# Patient Record
Sex: Female | Born: 1940
Health system: Southern US, Community
[De-identification: ages and names within clinical notes are randomized; demographics above are authoritative.]

## PROBLEM LIST (undated history)

## (undated) DIAGNOSIS — I4891 Unspecified atrial fibrillation: Secondary | ICD-10-CM

## (undated) DIAGNOSIS — H269 Unspecified cataract: Secondary | ICD-10-CM

## (undated) DIAGNOSIS — M8580 Other specified disorders of bone density and structure, unspecified site: Secondary | ICD-10-CM

## (undated) DIAGNOSIS — J449 Chronic obstructive pulmonary disease, unspecified: Secondary | ICD-10-CM

## (undated) DIAGNOSIS — M1990 Unspecified osteoarthritis, unspecified site: Secondary | ICD-10-CM

## (undated) DIAGNOSIS — E039 Hypothyroidism, unspecified: Secondary | ICD-10-CM

## (undated) DIAGNOSIS — T7840XA Allergy, unspecified, initial encounter: Secondary | ICD-10-CM

## (undated) DIAGNOSIS — E785 Hyperlipidemia, unspecified: Secondary | ICD-10-CM

## (undated) HISTORY — DX: Unspecified osteoarthritis, unspecified site: M19.90

## (undated) HISTORY — DX: Other specified disorders of bone density and structure, unspecified site: M85.80

## (undated) HISTORY — PX: VAGINAL HYSTERECTOMY: SUR661

## (undated) HISTORY — DX: Unspecified atrial fibrillation: I48.91

## (undated) HISTORY — DX: Hyperlipidemia, unspecified: E78.5

## (undated) HISTORY — DX: Unspecified cataract: H26.9

## (undated) HISTORY — DX: Allergy, unspecified, initial encounter: T78.40XA

## (undated) HISTORY — DX: Chronic obstructive pulmonary disease, unspecified: J44.9

## (undated) HISTORY — DX: Hypothyroidism, unspecified: E03.9

## (undated) HISTORY — PX: CHOLECYSTECTOMY: SHX55

---

## 2011-11-14 DIAGNOSIS — E559 Vitamin D deficiency, unspecified: Secondary | ICD-10-CM | POA: Insufficient documentation

## 2015-06-12 DIAGNOSIS — E782 Mixed hyperlipidemia: Secondary | ICD-10-CM | POA: Insufficient documentation

## 2015-06-13 DIAGNOSIS — E034 Atrophy of thyroid (acquired): Secondary | ICD-10-CM | POA: Insufficient documentation

## 2015-09-09 DIAGNOSIS — J189 Pneumonia, unspecified organism: Secondary | ICD-10-CM | POA: Insufficient documentation

## 2015-09-09 DIAGNOSIS — I480 Paroxysmal atrial fibrillation: Secondary | ICD-10-CM | POA: Insufficient documentation

## 2017-11-27 DIAGNOSIS — R0609 Other forms of dyspnea: Secondary | ICD-10-CM | POA: Insufficient documentation

## 2017-11-27 DIAGNOSIS — R9431 Abnormal electrocardiogram [ECG] [EKG]: Secondary | ICD-10-CM | POA: Insufficient documentation

## 2018-10-21 DIAGNOSIS — E782 Mixed hyperlipidemia: Secondary | ICD-10-CM | POA: Diagnosis not present

## 2018-10-21 DIAGNOSIS — Z0000 Encounter for general adult medical examination without abnormal findings: Secondary | ICD-10-CM | POA: Diagnosis not present

## 2018-10-21 DIAGNOSIS — E559 Vitamin D deficiency, unspecified: Secondary | ICD-10-CM | POA: Diagnosis not present

## 2018-10-21 DIAGNOSIS — Z1239 Encounter for other screening for malignant neoplasm of breast: Secondary | ICD-10-CM | POA: Diagnosis not present

## 2018-10-21 DIAGNOSIS — Z23 Encounter for immunization: Secondary | ICD-10-CM | POA: Diagnosis not present

## 2018-10-21 DIAGNOSIS — I480 Paroxysmal atrial fibrillation: Secondary | ICD-10-CM | POA: Diagnosis not present

## 2019-01-14 DIAGNOSIS — I480 Paroxysmal atrial fibrillation: Secondary | ICD-10-CM | POA: Diagnosis not present

## 2019-01-21 DIAGNOSIS — M8580 Other specified disorders of bone density and structure, unspecified site: Secondary | ICD-10-CM | POA: Diagnosis not present

## 2019-01-21 DIAGNOSIS — E782 Mixed hyperlipidemia: Secondary | ICD-10-CM | POA: Diagnosis not present

## 2019-01-21 DIAGNOSIS — Z23 Encounter for immunization: Secondary | ICD-10-CM | POA: Diagnosis not present

## 2019-01-21 DIAGNOSIS — I4891 Unspecified atrial fibrillation: Secondary | ICD-10-CM | POA: Diagnosis not present

## 2019-01-21 DIAGNOSIS — E034 Atrophy of thyroid (acquired): Secondary | ICD-10-CM | POA: Diagnosis not present

## 2019-01-21 DIAGNOSIS — I480 Paroxysmal atrial fibrillation: Secondary | ICD-10-CM | POA: Diagnosis not present

## 2019-01-21 DIAGNOSIS — R9431 Abnormal electrocardiogram [ECG] [EKG]: Secondary | ICD-10-CM | POA: Diagnosis not present

## 2019-01-21 DIAGNOSIS — Z780 Asymptomatic menopausal state: Secondary | ICD-10-CM | POA: Diagnosis not present

## 2019-01-21 DIAGNOSIS — E559 Vitamin D deficiency, unspecified: Secondary | ICD-10-CM | POA: Diagnosis not present

## 2019-05-06 DIAGNOSIS — E034 Atrophy of thyroid (acquired): Secondary | ICD-10-CM | POA: Diagnosis not present

## 2019-05-06 DIAGNOSIS — Z23 Encounter for immunization: Secondary | ICD-10-CM | POA: Diagnosis not present

## 2019-05-06 DIAGNOSIS — E559 Vitamin D deficiency, unspecified: Secondary | ICD-10-CM | POA: Diagnosis not present

## 2019-05-06 DIAGNOSIS — I480 Paroxysmal atrial fibrillation: Secondary | ICD-10-CM | POA: Diagnosis not present

## 2019-05-06 DIAGNOSIS — Z780 Asymptomatic menopausal state: Secondary | ICD-10-CM | POA: Diagnosis not present

## 2019-05-06 DIAGNOSIS — E782 Mixed hyperlipidemia: Secondary | ICD-10-CM | POA: Diagnosis not present

## 2019-07-28 DIAGNOSIS — H2513 Age-related nuclear cataract, bilateral: Secondary | ICD-10-CM | POA: Diagnosis not present

## 2019-07-28 DIAGNOSIS — H524 Presbyopia: Secondary | ICD-10-CM | POA: Diagnosis not present

## 2019-09-14 DIAGNOSIS — I480 Paroxysmal atrial fibrillation: Secondary | ICD-10-CM | POA: Diagnosis not present

## 2019-09-14 DIAGNOSIS — Z23 Encounter for immunization: Secondary | ICD-10-CM | POA: Diagnosis not present

## 2019-09-14 DIAGNOSIS — E034 Atrophy of thyroid (acquired): Secondary | ICD-10-CM | POA: Diagnosis not present

## 2019-09-14 DIAGNOSIS — E559 Vitamin D deficiency, unspecified: Secondary | ICD-10-CM | POA: Diagnosis not present

## 2019-09-14 DIAGNOSIS — E782 Mixed hyperlipidemia: Secondary | ICD-10-CM | POA: Diagnosis not present

## 2019-12-20 DIAGNOSIS — Z0000 Encounter for general adult medical examination without abnormal findings: Secondary | ICD-10-CM | POA: Diagnosis not present

## 2019-12-20 DIAGNOSIS — E782 Mixed hyperlipidemia: Secondary | ICD-10-CM | POA: Diagnosis not present

## 2019-12-20 DIAGNOSIS — M8580 Other specified disorders of bone density and structure, unspecified site: Secondary | ICD-10-CM | POA: Diagnosis not present

## 2019-12-20 DIAGNOSIS — R9431 Abnormal electrocardiogram [ECG] [EKG]: Secondary | ICD-10-CM | POA: Diagnosis not present

## 2019-12-20 DIAGNOSIS — I4891 Unspecified atrial fibrillation: Secondary | ICD-10-CM | POA: Diagnosis not present

## 2019-12-20 DIAGNOSIS — Z1239 Encounter for other screening for malignant neoplasm of breast: Secondary | ICD-10-CM | POA: Diagnosis not present

## 2019-12-20 DIAGNOSIS — I480 Paroxysmal atrial fibrillation: Secondary | ICD-10-CM | POA: Diagnosis not present

## 2019-12-20 DIAGNOSIS — E559 Vitamin D deficiency, unspecified: Secondary | ICD-10-CM | POA: Diagnosis not present

## 2019-12-20 DIAGNOSIS — E034 Atrophy of thyroid (acquired): Secondary | ICD-10-CM | POA: Diagnosis not present

## 2020-05-02 ENCOUNTER — Other Ambulatory Visit: Payer: Self-pay

## 2020-05-02 ENCOUNTER — Ambulatory Visit (INDEPENDENT_AMBULATORY_CARE_PROVIDER_SITE_OTHER): Payer: Medicare Other | Admitting: Family Medicine

## 2020-05-02 ENCOUNTER — Encounter: Payer: Self-pay | Admitting: Family Medicine

## 2020-05-02 VITALS — BP 123/72 | HR 61 | Temp 98.2°F | Ht 62.0 in | Wt 132.1 lb

## 2020-05-02 DIAGNOSIS — Z23 Encounter for immunization: Secondary | ICD-10-CM | POA: Diagnosis not present

## 2020-05-02 DIAGNOSIS — E039 Hypothyroidism, unspecified: Secondary | ICD-10-CM | POA: Diagnosis not present

## 2020-05-02 DIAGNOSIS — I4891 Unspecified atrial fibrillation: Secondary | ICD-10-CM | POA: Insufficient documentation

## 2020-05-02 DIAGNOSIS — E782 Mixed hyperlipidemia: Secondary | ICD-10-CM | POA: Diagnosis not present

## 2020-05-02 DIAGNOSIS — Z7901 Long term (current) use of anticoagulants: Secondary | ICD-10-CM | POA: Insufficient documentation

## 2020-05-02 DIAGNOSIS — G4700 Insomnia, unspecified: Secondary | ICD-10-CM | POA: Insufficient documentation

## 2020-05-02 DIAGNOSIS — M8580 Other specified disorders of bone density and structure, unspecified site: Secondary | ICD-10-CM

## 2020-05-02 DIAGNOSIS — I480 Paroxysmal atrial fibrillation: Secondary | ICD-10-CM

## 2020-05-02 DIAGNOSIS — E785 Hyperlipidemia, unspecified: Secondary | ICD-10-CM | POA: Insufficient documentation

## 2020-05-02 NOTE — Assessment & Plan Note (Signed)
New problem Trying melatonin at bedtime to see if this is helpful Will reassess at next visit

## 2020-05-02 NOTE — Assessment & Plan Note (Signed)
Chronic, paroxysmal Currently in normal sinus rhythm Continue Eliquis for chronic anticoagulation and metoprolol for rate control Reports she is status post cardioversion, but will review records when they are available Would like to continue seeing cardiology, so referral placed today to local cardiologist She does report a mild, leaky valve, so we will review echo results when those are available as well

## 2020-05-02 NOTE — Assessment & Plan Note (Signed)
On Eliquis for A. fib as above Check CBC

## 2020-05-02 NOTE — Assessment & Plan Note (Signed)
Unclear when her last DEXA scan was, so we will request records Continue vitamin D supplementation Discussed diet rich in calcium Discussed importance of weightbearing exercise

## 2020-05-02 NOTE — Assessment & Plan Note (Signed)
Patient reports dose of Synthroid was increased in 11/2019 as her TSH was elevated We will request records of previous TSH Continue current dose of Synthroid at this time as she is asymptomatic Recheck TSH and adjust Synthroid dose as needed

## 2020-05-02 NOTE — Progress Notes (Signed)
New Patient Office Visit  Subjective:  Patient ID: Emma Villarreal, female    DOB: Apr 21, 1941  Age: 80 y.o. MRN: [id number]  CC:  Chief Complaint  Patient presents with  . Establish Care    HPI Emma Villarreal presents for establishing care.    She was diagnosed with a fib in the hospital in the past and is s/p cardioversion.  She is taking eliquis and metoprolol for anticoag and rate control.  Seeing Dr Tiburcio Pea in [facility] for cardiology.  Was told that she had a leaky heart valve on last Echo - will send ROI for records.  Osteopenia: Taking Vit D daily.  HLD: Taking Atorvastatin 10 mg daily with good compliance. Unknown previous control.  Hypothyroidism: Taking Synthroid 50mg  daily with good compliance. Asymptomatic  Insomnia: Taking melatonin for the last few nights.  Having trouble falling asleep, not staying asleep    Past Medical History:  Diagnosis Date  . A-fib (HCC)   . Allergy   . Arthritis   . Cataract   . COPD (chronic obstructive pulmonary disease) (HCC)    patient reported not validated  . Hyperlipidemia   . Hypothyroidism   . Osteopenia     Past Surgical History:  Procedure Laterality Date  . CHOLECYSTECTOMY    . VAGINAL HYSTERECTOMY      Family History  Problem Relation Age of Onset  . Diabetes Daughter   . Stroke Maternal Grandmother   . Stroke Mother   . Dementia Mother   . Diabetes Daughter   . Colon cancer Maternal Aunt   . Breast cancer Neg Hx     Social History   Socioeconomic History  . Marital status: Widowed    Spouse name: Not on file  . Number of children: 5  . Years of education: Not on file  . Highest education level: Not on file  Occupational History  . Occupation: retired    Comment:   Tobacco Use  . Smoking status: Current Every Day Smoker    Packs/day: 0.50    Years: 60.00    Pack years: 30.00    Types: Cigarettes  . Smokeless tobacco: Never Used  Vaping Use  . Vaping Use: Never used  Substance and  Sexual Activity  . Alcohol use: Not Currently  . Drug use: Never  . Sexual activity: Not Currently  Other Topics Concern  . Not on file  Social History Narrative  . Not on file   Social Determinants of Health   Financial Resource Strain: Not on file  Food Insecurity: Not on file  Transportation Needs: Not on file  Physical Activity: Not on file  Stress: Not on file  Social Connections: Not on file  Intimate Partner Violence: Not on file    ROS Review of Systems  Constitutional: Positive for activity change.  HENT: Positive for congestion, dental problem, hearing loss, rhinorrhea, sinus pressure and sneezing.   Eyes: Positive for visual disturbance.  Respiratory: Positive for cough and shortness of breath.   Endocrine: Positive for cold intolerance and polyuria.  Musculoskeletal: Positive for arthralgias, back pain and neck stiffness.  Allergic/Immunologic: Positive for environmental allergies.  Hematological: Bruises/bleeds easily.    Objective:   Today's Vitals: BP 123/72 (BP Location: Left Arm, Patient Position: Sitting, Cuff Size: Large)   Pulse 61   Temp 98.2 F (36.8 C) (Oral)   Ht 5\' 2"  (1.575 m)   Wt 132 lb 1.6 oz (59.9 kg)   BMI 24.16 kg/m   Physical  Exam Vitals reviewed.  Constitutional:      General: She is not in acute distress.    Appearance: Normal appearance. She is well-developed. She is not diaphoretic.  HENT:     Head: Normocephalic and atraumatic.     Right Ear: Tympanic membrane, ear canal and external ear normal.     Left Ear: Tympanic membrane, ear canal and external ear normal.  Eyes:     General: No scleral icterus.    Conjunctiva/sclera: Conjunctivae normal.     Pupils: Pupils are equal, round, and reactive to light.  Neck:     Thyroid: No thyromegaly.  Cardiovascular:     Rate and Rhythm: Normal rate and regular rhythm.     Pulses: Normal pulses.     Heart sounds: Normal heart sounds. No murmur heard.   Pulmonary:     Effort:  Pulmonary effort is normal. No respiratory distress.     Breath sounds: Normal breath sounds. No wheezing or rales.  Abdominal:     General: There is no distension.     Palpations: Abdomen is soft.     Tenderness: There is no abdominal tenderness.  Musculoskeletal:        General: No deformity.     Cervical back: Neck supple.     Right lower leg: No edema.     Left lower leg: No edema.  Lymphadenopathy:     Cervical: No cervical adenopathy.  Skin:    General: Skin is warm and dry.     Findings: No rash.  Neurological:     Mental Status: She is alert and oriented to person, place, and time. Mental status is at baseline.     Sensory: No sensory deficit.     Motor: No weakness.     Gait: Gait normal.  Psychiatric:        Mood and Affect: Mood normal.        Behavior: Behavior normal.        Thought Content: Thought content normal.     Assessment & Plan:   Problem List Items Addressed This Visit      Cardiovascular and Mediastinum   A-fib (HCC) - Primary    Chronic, paroxysmal Currently in normal sinus rhythm Continue Eliquis for chronic anticoagulation and metoprolol for rate control Reports she is status post cardioversion, but will review records when they are available Would like to continue seeing cardiology, so referral placed today to local cardiologist She does report a mild, leaky valve, so we will review echo results when those are available as well      Relevant Medications   metoprolol tartrate (LOPRESSOR) 25 MG tablet   atorvastatin (LIPITOR) 10 MG tablet   apixaban (ELIQUIS) 5 MG TABS tablet   Other Relevant Orders   Ambulatory referral to Cardiology   CBC     Endocrine   Hypothyroidism    Patient reports dose of Synthroid was increased in 11/2019 as her TSH was elevated We will request records of previous TSH Continue current dose of Synthroid at this time as she is asymptomatic Recheck TSH and adjust Synthroid dose as needed      Relevant  Medications   metoprolol tartrate (LOPRESSOR) 25 MG tablet   levothyroxine (SYNTHROID) 50 MCG tablet   Other Relevant Orders   TSH     Musculoskeletal and Integument   Osteopenia    Unclear when her last DEXA scan was, so we will request records Continue vitamin D supplementation Discussed diet rich in calcium  Discussed importance of weightbearing exercise        Other   Hyperlipidemia    Previous control unknown ROI sent to previous PCP for last labs Continue atorvastatin 10 mg daily She is having no side effects Recheck FLP and CMP      Relevant Medications   metoprolol tartrate (LOPRESSOR) 25 MG tablet   atorvastatin (LIPITOR) 10 MG tablet   apixaban (ELIQUIS) 5 MG TABS tablet   Other Relevant Orders   Lipid panel   Comprehensive metabolic panel   Insomnia    New problem Trying melatonin at bedtime to see if this is helpful Will reassess at next visit      Chronic anticoagulation    On Eliquis for A. fib as above Check CBC      Relevant Orders   CBC    Other Visit Diagnoses    Influenza vaccine needed       Relevant Orders   Flu vaccine HIGH DOSE PF (Completed)      ROI sent to former PCP and Cardiologist COVID vaccines abstracted into chart   Follow-up: Return in about 6 months (around 10/30/2020) for CPE, AWV.  Total time spent on today's visit was greater than 45 minutes, including both face-to-face time and nonface-to-face time personally spent on review of chart (labs and imaging), discussing labs and goals, discussing further work-up, treatment options, referrals to specialist if needed, reviewing outside records of pertinent, answering patient's questions, and coordinating care.    I, Shirlee Latch, MD, have reviewed all documentation for this visit. The documentation on 05/02/20 for the exam, diagnosis, procedures, and orders are all accurate and complete.   Darlene Brozowski, Marzella Schlein, MD, MPH [facility]

## 2020-05-02 NOTE — Assessment & Plan Note (Signed)
Previous control unknown ROI sent to previous PCP for last labs Continue atorvastatin 10 mg daily She is having no side effects Recheck FLP and CMP

## 2020-05-03 ENCOUNTER — Telehealth: Payer: Self-pay

## 2020-05-03 NOTE — Telephone Encounter (Signed)
Did not see any documentation about a call.    Copied from CRM [id number]. Topic: General - Other >> May 03, 2020 11:16 AM Elliot Gault wrote: Reason for CRM: patient returning "Emma Villarreal" call, please advise

## 2020-05-19 DIAGNOSIS — I4891 Unspecified atrial fibrillation: Secondary | ICD-10-CM | POA: Diagnosis not present

## 2020-05-19 DIAGNOSIS — E039 Hypothyroidism, unspecified: Secondary | ICD-10-CM | POA: Diagnosis not present

## 2020-05-19 DIAGNOSIS — E782 Mixed hyperlipidemia: Secondary | ICD-10-CM | POA: Diagnosis not present

## 2020-05-19 DIAGNOSIS — Z7901 Long term (current) use of anticoagulants: Secondary | ICD-10-CM | POA: Diagnosis not present

## 2020-05-20 LAB — LIPID PANEL
Chol/HDL Ratio: 2.3 ratio (ref 0.0–4.4)
Cholesterol, Total: 147 mg/dL (ref 100–199)
HDL: 63 mg/dL (ref >39)
LDL Chol Calc (NIH): 59 mg/dL (ref 0–99)
Triglycerides: 149 mg/dL (ref 0–149)
VLDL Cholesterol Cal: 25 mg/dL (ref 5–40)

## 2020-05-20 LAB — COMPREHENSIVE METABOLIC PANEL
ALT: 29 IU/L (ref 0–32)
AST: 27 IU/L (ref 0–40)
Albumin/Globulin Ratio: 1.6 (ref 1.2–2.2)
Albumin: 4.7 g/dL (ref 3.7–4.7)
Alkaline Phosphatase: 69 IU/L (ref 44–121)
BUN/Creatinine Ratio: 13 (ref 12–28)
BUN: 11 mg/dL (ref 8–27)
Bilirubin Total: 0.5 mg/dL (ref 0.0–1.2)
CO2: 27 mmol/L (ref 20–29)
Calcium: 9.9 mg/dL (ref 8.7–10.3)
Chloride: 99 mmol/L (ref 96–106)
Creatinine, Ser: 0.86 mg/dL (ref 0.57–1.00)
GFR calc Af Amer: 74 mL/min/{1.73_m2} (ref >59)
GFR calc non Af Amer: 64 mL/min/{1.73_m2} (ref >59)
Globulin, Total: 2.9 g/dL (ref 1.5–4.5)
Glucose: 112 mg/dL — ABNORMAL HIGH (ref 65–99)
Potassium: 5 mmol/L (ref 3.5–5.2)
Sodium: 140 mmol/L (ref 134–144)
Total Protein: 7.6 g/dL (ref 6.0–8.5)

## 2020-05-20 LAB — CBC
Hematocrit: 46.9 % — ABNORMAL HIGH (ref 34.0–46.6)
Hemoglobin: 15.1 g/dL (ref 11.1–15.9)
MCH: 31.5 pg (ref 26.6–33.0)
MCHC: 32.2 g/dL (ref 31.5–35.7)
MCV: 98 fL — ABNORMAL HIGH (ref 79–97)
Platelets: 228 10*3/uL (ref 150–450)
RBC: 4.8 x10E6/uL (ref 3.77–5.28)
RDW: 12.6 % (ref 11.7–15.4)
WBC: 8.2 10*3/uL (ref 3.4–10.8)

## 2020-05-20 LAB — TSH: TSH: 3.21 u[IU]/mL (ref 0.450–4.500)

## 2020-05-22 ENCOUNTER — Telehealth: Payer: Self-pay

## 2020-05-22 NOTE — Telephone Encounter (Signed)
-----   Message from Erasmo Downer, MD sent at 05/22/2020  9:38 AM EST ----- Normal labs, except blood sugar is high if patient was fasting.  If not fasting, then normal.  Can try to add on A1c.

## 2020-05-22 NOTE — Telephone Encounter (Signed)
Pt called and verbalized understanding to information below. Pt claimed she had been fasting when her blood was drawn which would put her in a hyperglycemic range.

## 2020-05-23 ENCOUNTER — Telehealth: Payer: Self-pay

## 2020-05-23 NOTE — Telephone Encounter (Signed)
Copied from CRM [id number]. Topic: General - Call Back - No Documentation >> May 22, 2020  5:23 PM Randol Kern wrote: Best contact: [phone]  Pt returned missed call from the office, wants return call please advise. States no VM was left.

## 2020-05-26 LAB — HGB A1C W/O EAG: Hgb A1c MFr Bld: 5.6 % (ref 4.8–5.6)

## 2020-05-26 LAB — SPECIMEN STATUS REPORT

## 2020-06-06 ENCOUNTER — Ambulatory Visit: Payer: Medicare Other | Admitting: Cardiovascular Disease

## 2020-06-06 ENCOUNTER — Encounter: Payer: Self-pay | Admitting: Cardiovascular Disease

## 2020-06-06 ENCOUNTER — Other Ambulatory Visit: Payer: Self-pay

## 2020-06-06 VITALS — BP 140/62 | HR 68 | Ht 62.0 in | Wt 132.4 lb

## 2020-06-06 DIAGNOSIS — Z7901 Long term (current) use of anticoagulants: Secondary | ICD-10-CM | POA: Diagnosis not present

## 2020-06-06 DIAGNOSIS — F17200 Nicotine dependence, unspecified, uncomplicated: Secondary | ICD-10-CM | POA: Diagnosis not present

## 2020-06-06 DIAGNOSIS — E782 Mixed hyperlipidemia: Secondary | ICD-10-CM

## 2020-06-06 DIAGNOSIS — I480 Paroxysmal atrial fibrillation: Secondary | ICD-10-CM | POA: Diagnosis not present

## 2020-06-06 DIAGNOSIS — R0789 Other chest pain: Secondary | ICD-10-CM

## 2020-06-06 NOTE — Patient Instructions (Signed)
Medication Instructions:  No changes  For breakthrough atrial fib, Take extra metoprolol 1 pill  If you need a refill on your cardiac medications before your next appointment, please call your pharmacy.    Lab work: No new labs needed   If you have labs (blood work) drawn today and your tests are completely normal, you will receive your results only by: Marland Kitchen MyChart Message (if you have MyChart) OR . A paper copy in the mail If you have any lab test that is abnormal or we need to change your treatment, we will call you to review the results.   Testing/Procedures: No new testing needed   Follow-Up: At [facility], you and your health needs are our priority.  As part of our continuing mission to provide you with exceptional heart care, we have created designated Provider Care Teams.  These Care Teams include your primary Cardiologist (physician) and Advanced Practice Providers (APPs -  Physician Assistants and Nurse Practitioners) who all work together to provide you with the care you need, when you need it.  . You will need a follow up appointment in 6 months  . Providers on your designated Care Team:   . Nicolasa Ducking, NP . Eula Listen, PA-C . Marisue Ivan, PA-C  Any Other Special Instructions Will Be Listed Below (If Applicable).  COVID-19 Vaccine Information can be found at: [email or web address] For questions related to vaccine distribution or appointments, please email [email or web address] or call [phone].

## 2020-06-06 NOTE — Progress Notes (Signed)
Cardiology Office Note  Date:  06/06/2020   ID:  Emma Villarreal, DOB [birth year], MRN [id number]  PCP:  Erasmo Downer, MD   Chief Complaint  Patient presents with  . Other    Afib c/o chest tightness. Meds reviewed verbally with pt.    HPI:  [name] is a 80 year old woman with past medical history of Atrial fibrillation, cardioversion 2017 Hyperlipidemia Smoker 3pp week Presenting by referral for new patient evaluation for atrial fibrillation  Records have been requested for her visit today Seen in the hospital in 2017 for atrial fibrillation,  underwent cardioversion, started on Eliquis metoprolol  Seen by Cardiology in follow-up Underwent various testing, records requested Echocardiogram 2019, told she had a mild leak of the valve Stress test September 2019  On discussion of any paroxysmal symptoms, she reports that "Every now and then tachycardia/ chest pressure" Maybe similar to 2017 Does not check her heart rate when she has these episodes, typically present at rest  No regular exercise program  EKG personally reviewed by myself on todays visit nsr rate 68 T wave ABN V3 through V6, inferior leads II, III, aVF   PMH:   has a past medical history of A-fib (HCC), Allergy, Arthritis, Cataract, COPD (chronic obstructive pulmonary disease) (HCC), Hyperlipidemia, Hypothyroidism, and Osteopenia.  PSH:    Past Surgical History:  Procedure Laterality Date  . CHOLECYSTECTOMY    . VAGINAL HYSTERECTOMY      Current Outpatient Medications  Medication Sig Dispense Refill  . apixaban (ELIQUIS) 5 MG TABS tablet Take 5 mg by mouth 2 (two) times daily.    Marland Kitchen atorvastatin (LIPITOR) 10 MG tablet Take 10 mg by mouth daily. Take one tablet PO at bedtime    . Cholecalciferol 50 MCG (2000 UT) TABS Take by mouth daily.    . Cyanocobalamin (B-12 PO) Take by mouth daily in the afternoon.    Marland Kitchen ELDERBERRY PO Take by mouth daily at 8 pm.    . levothyroxine (SYNTHROID) 50 MCG  tablet Take 50 mcg by mouth daily before breakfast.    . metoprolol tartrate (LOPRESSOR) 25 MG tablet Take 12.5 mg by mouth in the morning and at bedtime.     No current facility-administered medications for this visit.    Allergies:   Molds & smuts   Social History:  The patient  reports that she has been smoking cigarettes. She has a 30.00 pack-year smoking history. She has never used smokeless tobacco. She reports previous alcohol use. She reports that she does not use drugs.   Family History:   family history includes Colon cancer in her maternal aunt; Dementia in her mother; Diabetes in her daughter and daughter; Stroke in her maternal grandmother and mother.    Review of Systems: Review of Systems  Constitutional: Negative.   HENT: Negative.   Respiratory: Negative.   Cardiovascular: Positive for palpitations.  Gastrointestinal: Negative.   Musculoskeletal: Negative.   Neurological: Negative.   Psychiatric/Behavioral: Negative.   All other systems reviewed and are negative.   PHYSICAL EXAM: VS:  BP 140/62 (BP Location: Right Arm, Patient Position: Sitting, Cuff Size: Normal)   Pulse 68   Ht 5\' 2"  (1.575 m)   Wt 132 lb 6 oz (60 kg)   SpO2 96%   BMI 24.21 kg/m  , BMI Body mass index is 24.21 kg/m. GEN: Well nourished, well developed, in no acute distress HEENT: normal Neck: no JVD, carotid bruits, or masses Cardiac: RRR; no murmurs, rubs, or gallops,no edema  Respiratory:  clear to auscultation bilaterally, normal work of breathing GI: soft, nontender, nondistended, + BS MS: no deformity or atrophy Skin: warm and dry, no rash Neuro:  Strength and sensation are intact Psych: euthymic mood, full affect  Recent Labs: 05/19/2020: ALT 29; BUN 11; Creatinine, Ser 0.86; Hemoglobin 15.1; Platelets 228; Potassium 5.0; Sodium 140; TSH 3.210    Lipid Panel Lab Results  Component Value Date   CHOL 147 05/19/2020   HDL 63 05/19/2020   LDLCALC 59 05/19/2020   TRIG 149  05/19/2020      Wt Readings from Last 3 Encounters:  06/06/20 132 lb 6 oz (60 kg)  05/02/20 132 lb 1.6 oz (59.9 kg)     ASSESSMENT AND PLAN:  Problem List Items Addressed This Visit      Cardiology Problems   A-fib [facility]) - Primary   Relevant Orders   EKG 12-Lead   Hyperlipidemia     Other   Chronic anticoagulation    Other Visit Diagnoses    Smoker       Chest tightness         Paroxysmal atrial fibrillation On Eliquis, no bleeding, no falls Unclear if she is having paroxysmal symptoms as she does report episodes of shortness of breath and chest fullness typically at rest She'll start monitoring her pulse when she has these episodes We discussed angina, if symptoms get worse especially with exertion , recommend she call our office for further testing Recommended if she does document elevated heart rate concerning for atrial fibrillation that she take extra metoprolol 25 mg x 1 She does not want metoprolol tartrate 25 twice daily on a regular basis, reports having bradycardia in the past  Smoker We have encouraged her to continue to work on weaning her cigarettes and smoking cessation. She will continue to work on this and does not want any assistance with chantix.   Hyperlipidemia Cholesterol is at goal on the current lipid regimen. No changes to the medications were made.  Cardiac risk factors High risk of coronary disease given long smoking history, hyperlipidemia Some chest discomfort as above which we will watch closely low currently presenting at rest, sparingly, unable to exclude atrial fibrillation   Total encounter time more than 60 minutes  Greater than 50% was spent in counseling and coordination of care with the patient    Signed, Dossie Arbour, M.D., Ph.D. [facility] [place], [place] [phone]

## 2020-06-15 NOTE — Progress Notes (Signed)
Subjective:   Emma Villarreal is a 80 y.o. female who presents for Medicare Annual (Subsequent) preventive examination.  I connected with Emma Villarreal today by telephone and verified that I am speaking with the correct person using two identifiers. Location patient: home Location provider: work Persons participating in the virtual visit: patient, provider.   I discussed the limitations, risks, security and privacy concerns of performing an evaluation and management service by telephone and the availability of in person appointments. I also discussed with the patient that there may be a patient responsible charge related to this service. The patient expressed understanding and verbally consented to this telephonic visit.    Interactive audio and video telecommunications were attempted between this provider and patient, however failed, due to patient having technical difficulties OR patient did not have access to video capability.  We continued and completed visit with audio only.   Review of Systems    N/A  Cardiac Risk Factors include: advanced age (>1455men, 66>65 women);dyslipidemia;hypertension;smoking/ tobacco exposure     Objective:    There were no vitals filed for this visit. There is no height or weight on file to calculate BMI.  Advanced Directives 06/19/2020  Does Patient Have a Medical Advance Directive? Yes  Type of Estate agentAdvance Directive Healthcare Power of ChildressAttorney;Living will  Copy of Healthcare Power of Attorney in Chart? No - copy requested    Current Medications (verified) Outpatient Encounter Medications as of 06/19/2020  Medication Sig  . apixaban (ELIQUIS) 5 MG TABS tablet Take 5 mg by mouth 2 (two) times daily.  Marland Kitchen. atorvastatin (LIPITOR) 10 MG tablet Take 10 mg by mouth daily. Take one tablet PO at bedtime  . Cholecalciferol 50 MCG (2000 UT) TABS Take by mouth daily.  . Cyanocobalamin (B-12 PO) Take by mouth daily in the afternoon.  Marland Kitchen. ELDERBERRY PO Take by mouth daily  at 8 pm.  . levothyroxine (SYNTHROID) 50 MCG tablet Take 50 mcg by mouth daily before breakfast.  . metoprolol tartrate (LOPRESSOR) 25 MG tablet Take 12.5 mg by mouth in the morning and at bedtime.   No facility-administered encounter medications on file as of 06/19/2020.    Allergies (verified) Varenicline and Molds & smuts   History: Past Medical History:  Diagnosis Date  . A-fib (HCC)   . Allergy   . Arthritis   . Cataract   . COPD (chronic obstructive pulmonary disease) (HCC)    patient reported not validated  . Hyperlipidemia   . Hypothyroidism   . Osteopenia    Past Surgical History:  Procedure Laterality Date  . CHOLECYSTECTOMY    . VAGINAL HYSTERECTOMY     Family History  Problem Relation Age of Onset  . Diabetes Daughter   . Stroke Maternal Grandmother   . Stroke Mother   . Dementia Mother   . Diabetes Daughter   . Colon cancer Maternal Aunt   . Breast cancer Neg Hx    Social History   Socioeconomic History  . Marital status: Widowed    Spouse name: Not on file  . Number of children: 5  . Years of education: Not on file  . Highest education level: High school graduate  Occupational History  . Occupation: retired    Comment: Print production planneroffice manager   Tobacco Use  . Smoking status: Current Every Day Smoker    Packs/day: 0.50    Years: 60.00    Pack years: 30.00    Types: Cigarettes  . Smokeless tobacco: Never Used  Vaping Use  .  Vaping Use: Never used  Substance and Sexual Activity  . Alcohol use: Not Currently  . Drug use: Never  . Sexual activity: Not Currently  Other Topics Concern  . Not on file  Social History Narrative  . Not on file   Social Determinants of Health   Financial Resource Strain: Low Risk   . Difficulty of Paying Living Expenses: Not hard at all  Food Insecurity: No Food Insecurity  . Worried About Programme researcher, broadcasting/film/video in the Last Year: Never true  . Ran Out of Food in the Last Year: Never true  Transportation Needs: No  Transportation Needs  . Lack of Transportation (Medical): No  . Lack of Transportation (Non-Medical): No  Physical Activity: Inactive  . Days of Exercise per Week: 0 days  . Minutes of Exercise per Session: 0 min  Stress: No Stress Concern Present  . Feeling of Stress : Not at all  Social Connections: Moderately Isolated  . Frequency of Communication with Friends and Family: More than three times a week  . Frequency of Social Gatherings with Friends and Family: More than three times a week  . Attends Religious Services: Never  . Active Member of Clubs or Organizations: No  . Attends Banker Meetings: Never  . Marital Status: Married    Tobacco Counseling Ready to quit: Yes Counseling given: No   Clinical Intake:  Pre-visit preparation completed: Yes  Pain : No/denies pain     Nutritional Risks: None Diabetes: No  How often do you need to have someone help you when you read instructions, pamphlets, or other written materials from your doctor or pharmacy?: 1 - Never  Diabetic? No  Interpreter Needed?: No  Information entered by :: [facility], LPN   Activities of Daily Living In your present state of health, do you have any difficulty performing the following activities: 06/19/2020 05/02/2020  Hearing? N Y  Vision? N N  Difficulty concentrating or making decisions? N N  Walking or climbing stairs? N N  Dressing or bathing? N N  Doing errands, shopping? N N  Comment Not driving currently. -  Preparing Food and eating ? N -  Using the Toilet? N -  In the past six months, have you accidently leaked urine? Y -  Comment Wears pads daily. -  Do you have problems with loss of bowel control? N -  Managing your Medications? N -  Managing your Finances? N -  Housekeeping or managing your Housekeeping? N -    Patient Care Team: Erasmo Downer, MD as PCP - General (Family Medicine) Antonieta Iba, MD as Consulting Physician (Cardiology) [facility], [facility])  Indicate any recent Medical Services you may have received from other than [facility] providers in the past year (date may be approximate).     Assessment:   This is a routine wellness examination for Emma Villarreal.  Hearing/Vision screen No exam data present  Dietary issues and exercise activities discussed: Current Exercise Habits: The patient does not participate in regular exercise at present, Exercise limited by: None identified  Goals    . Quit Smoking     Recommend to continue efforts to reduce smoking habits until no longer smoking.       Depression Screen PHQ 2/9 Scores 06/19/2020 05/02/2020  PHQ - 2 Score 1 0  PHQ- 9 Score - 3    Fall Risk Fall Risk  06/19/2020 05/02/2020  Falls in the past year? 0 0  Number falls  in past yr: 0 0  Injury with Fall? 0 0    FALL RISK PREVENTION PERTAINING TO THE HOME:  Any stairs in or around the home? No  If so, are there any without handrails? No Home free of loose throw rugs in walkways, pet beds, electrical cords, etc? Yes  Adequate lighting in your home to reduce risk of falls? Yes   ASSISTIVE DEVICES UTILIZED TO PREVENT FALLS:  Life alert? No  Use of a cane, walker or w/c? No  Grab bars in the bathroom? Yes  Shower chair or bench in shower? No  Elevated toilet seat or a handicapped toilet? No    Cognitive Function: Normal cognitive status assessed by observation by this Nurse Health Advisor. No abnormalities found.          Immunizations Immunization History  Administered Date(s) Administered  . Influenza, High Dose Seasonal PF 02/09/2013, 06/12/2015, 02/02/2016, 01/21/2019, 05/02/2020  . PFIZER(Purple Top)SARS-COV-2 Vaccination 05/17/2019, 06/07/2019, 02/26/2020  . Pneumococcal Polysaccharide-23 08/02/2009  . Zoster 08/02/2009    TDAP status: Due, Education has been provided regarding the importance of this vaccine. Advised may receive this vaccine at local pharmacy or Health Dept. Aware to provide a  copy of the vaccination record if obtained from local pharmacy or Health Dept. Verbalized acceptance and understanding.  Flu Vaccine status: Up to date  Pneumococcal vaccine status: Due, Education has been provided regarding the importance of this vaccine. Advised may receive this vaccine at local pharmacy or Health Dept. Aware to provide a copy of the vaccination record if obtained from local pharmacy or Health Dept. Verbalized acceptance and understanding.  Covid-19 vaccine status: Completed vaccines  Qualifies for Shingles Vaccine? Yes   Zostavax completed Yes   Shingrix Completed?: No.    Education has been provided regarding the importance of this vaccine. Patient has been advised to call insurance company to determine out of pocket expense if they have not yet received this vaccine. Advised may also receive vaccine at local pharmacy or Health Dept. Verbalized acceptance and understanding.  Screening Tests Health Maintenance  Topic Date Due  . PNA vac Low Risk Adult (2 of 2 - PCV13) 08/03/2010  . DEXA SCAN  07/13/2018  . TETANUS/TDAP  06/19/2021 (Originally 12/16/1959)  . INFLUENZA VACCINE  Completed  . COVID-19 Vaccine  Completed    Health Maintenance  Health Maintenance Due  Topic Date Due  . PNA vac Low Risk Adult (2 of 2 - PCV13) 08/03/2010  . DEXA SCAN  07/13/2018    Colorectal cancer screening: No longer required.   Mammogram status: No longer required due to age.  Bone Density status: Ordered today. Pt provided with contact info and advised to call to schedule appt.  Lung Cancer Screening: (Low Dose CT Chest recommended if Age 49-80 years, 30 pack-year currently smoking OR have quit w/in 15years.) does not qualify.   Additional Screening:  Vision Screening: Recommended annual ophthalmology exams for early detection of glaucoma and other disorders of the eye. Is the patient up to date with their annual eye exam?  Yes  Who is the provider or what is the name of the  office in which the patient attends annual eye exams? [facility] If pt is not established with a provider, would they like to be referred to a provider to establish care? No .   Dental Screening: Recommended annual dental exams for proper oral hygiene  Community Resource Referral / Chronic Care Management: CRR required this visit?  No  CCM required this visit?  No      Plan:     I have personally reviewed and noted the following in the patient's chart:   . Medical and social history . Use of alcohol, tobacco or illicit drugs  . Current medications and supplements . Functional ability and status . Nutritional status . Physical activity . Advanced directives . List of other physicians . Hospitalizations, surgeries, and ER visits in previous 12 months . Vitals . Screenings to include cognitive, depression, and falls . Referrals and appointments  In addition, I have reviewed and discussed with patient certain preventive protocols, quality metrics, and best practice recommendations. A written personalized care plan for preventive services as well as general preventive health recommendations were provided to patient.     Shilo Philipson [place], [place]   1/61/0960   Nurse Notes: Pt would like to receive a Prevnar 13 vaccine at next in office apt.

## 2020-06-19 ENCOUNTER — Other Ambulatory Visit: Payer: Self-pay

## 2020-06-19 ENCOUNTER — Ambulatory Visit (INDEPENDENT_AMBULATORY_CARE_PROVIDER_SITE_OTHER): Payer: Medicare Other

## 2020-06-19 DIAGNOSIS — E2839 Other primary ovarian failure: Secondary | ICD-10-CM | POA: Diagnosis not present

## 2020-06-19 DIAGNOSIS — Z0000 Encounter for general adult medical examination without abnormal findings: Secondary | ICD-10-CM | POA: Diagnosis not present

## 2020-06-19 NOTE — Patient Instructions (Signed)
Emma Villarreal , Thank you for taking time to come for your Medicare Wellness Visit. I appreciate your ongoing commitment to your health goals. Please review the following plan we discussed and let me know if I can assist you in the future.   Screening recommendations/referrals: Colonoscopy: No longer required.  Mammogram: No longer required.  Bone Density: Ordered today. Pt aware office will call QM:VHQI. Recommended yearly ophthalmology/optometry visit for glaucoma screening and checkup Recommended yearly dental visit for hygiene and checkup  Vaccinations: Influenza vaccine: Done 05/02/20 Pneumococcal vaccine: Prevnar 13 due. Will receive at next in office apt.  Tdap vaccine: Currently due, declined receiving. Shingles vaccine: Shingrix discussed. Please contact your pharmacy for coverage information.     Advanced directives: Please bring a copy of your POA (Power of Attorney) and/or Living Will to your next appointment.   Conditions/risks identified: Smoking cessation discussed today.   Next appointment: 11/07/20 @ 3:00 PM with Dr Beryle Flock    Preventive Care 65 Years and Older, Female Preventive care refers to lifestyle choices and visits with your health care provider that can promote health and wellness. What does preventive care include?  A yearly physical exam. This is also called an annual well check.  Dental exams once or twice a year.  Routine eye exams. Ask your health care provider how often you should have your eyes checked.  Personal lifestyle choices, including:  Daily care of your teeth and gums.  Regular physical activity.  Eating a healthy diet.  Avoiding tobacco and drug use.  Limiting alcohol use.  Practicing safe sex.  Taking low-dose aspirin every day.  Taking vitamin and mineral supplements as recommended by your health care provider. What happens during an annual well check? The services and screenings done by your health care provider during  your annual well check will depend on your age, overall health, lifestyle risk factors, and family history of disease. Counseling  Your health care provider may ask you questions about your:  Alcohol use.  Tobacco use.  Drug use.  Emotional well-being.  Home and relationship well-being.  Sexual activity.  Eating habits.  History of falls.  Memory and ability to understand (cognition).  Work and work Astronomer.  Reproductive health. Screening  You may have the following tests or measurements:  Height, weight, and BMI.  Blood pressure.  Lipid and cholesterol levels. These may be checked every 5 years, or more frequently if you are over 71 years old.  Skin check.  Lung cancer screening. You may have this screening every year starting at age 61 if you have a 30-pack-year history of smoking and currently smoke or have quit within the past 15 years.  Fecal occult blood test (FOBT) of the stool. You may have this test every year starting at age 60.  Flexible sigmoidoscopy or colonoscopy. You may have a sigmoidoscopy every 5 years or a colonoscopy every 10 years starting at age 83.  Hepatitis C blood test.  Hepatitis B blood test.  Sexually transmitted disease (STD) testing.  Diabetes screening. This is done by checking your blood sugar (glucose) after you have not eaten for a while (fasting). You may have this done every 1-3 years.  Bone density scan. This is done to screen for osteoporosis. You may have this done starting at age 58.  Mammogram. This may be done every 1-2 years. Talk to your health care provider about how often you should have regular mammograms. Talk with your health care provider about your test results, treatment  options, and if necessary, the need for more tests. Vaccines  Your health care provider may recommend certain vaccines, such as:  Influenza vaccine. This is recommended every year.  Tetanus, diphtheria, and acellular pertussis (Tdap,  Td) vaccine. You may need a Td booster every 10 years.  Zoster vaccine. You may need this after age 55.  Pneumococcal 13-valent conjugate (PCV13) vaccine. One dose is recommended after age 20.  Pneumococcal polysaccharide (PPSV23) vaccine. One dose is recommended after age 5. Talk to your health care provider about which screenings and vaccines you need and how often you need them. This information is not intended to replace advice given to you by your health care provider. Make sure you discuss any questions you have with your health care provider. Document Released: 05/05/2015 Document Revised: 12/27/2015 Document Reviewed: 02/07/2015 Elsevier Interactive Patient Education  2017 [place] Prevention in the Home Falls can cause injuries. They can happen to people of all ages. There are many things you can do to make your home safe and to help prevent falls. What can I do on the outside of my home?  Regularly fix the edges of walkways and driveways and fix any cracks.  Remove anything that might make you trip as you walk through a door, such as a raised step or threshold.  Trim any bushes or trees on the path to your home.  Use bright outdoor lighting.  Clear any walking paths of anything that might make someone trip, such as rocks or tools.  Regularly check to see if handrails are loose or broken. Make sure that both sides of any steps have handrails.  Any raised decks and porches should have guardrails on the edges.  Have any leaves, snow, or ice cleared regularly.  Use sand or salt on walking paths during winter.  Clean up any spills in your garage right away. This includes oil or grease spills. What can I do in the bathroom?  Use night lights.  Install grab bars by the toilet and in the tub and shower. Do not use towel bars as grab bars.  Use non-skid mats or decals in the tub or shower.  If you need to sit down in the shower, use a plastic, non-slip  stool.  Keep the floor dry. Clean up any water that spills on the floor as soon as it happens.  Remove soap buildup in the tub or shower regularly.  Attach bath mats securely with double-sided non-slip rug tape.  Do not have throw rugs and other things on the floor that can make you trip. What can I do in the bedroom?  Use night lights.  Make sure that you have a light by your bed that is easy to reach.  Do not use any sheets or blankets that are too big for your bed. They should not hang down onto the floor.  Have a firm chair that has side arms. You can use this for support while you get dressed.  Do not have throw rugs and other things on the floor that can make you trip. What can I do in the kitchen?  Clean up any spills right away.  Avoid walking on wet floors.  Keep items that you use a lot in easy-to-reach places.  If you need to reach something above you, use a strong step stool that has a grab bar.  Keep electrical cords out of the way.  Do not use floor polish or wax that makes floors slippery.  If you must use wax, use non-skid floor wax.  Do not have throw rugs and other things on the floor that can make you trip. What can I do with my stairs?  Do not leave any items on the stairs.  Make sure that there are handrails on both sides of the stairs and use them. Fix handrails that are broken or loose. Make sure that handrails are as long as the stairways.  Check any carpeting to make sure that it is firmly attached to the stairs. Fix any carpet that is loose or worn.  Avoid having throw rugs at the top or bottom of the stairs. If you do have throw rugs, attach them to the floor with carpet tape.  Make sure that you have a light switch at the top of the stairs and the bottom of the stairs. If you do not have them, ask someone to add them for you. What else can I do to help prevent falls?  Wear shoes that:  Do not have high heels.  Have rubber bottoms.  Are  comfortable and fit you well.  Are closed at the toe. Do not wear sandals.  If you use a stepladder:  Make sure that it is fully opened. Do not climb a closed stepladder.  Make sure that both sides of the stepladder are locked into place.  Ask someone to hold it for you, if possible.  Clearly mark and make sure that you can see:  Any grab bars or handrails.  First and last steps.  Where the edge of each step is.  Use tools that help you move around (mobility aids) if they are needed. These include:  Canes.  Walkers.  Scooters.  Crutches.  Turn on the lights when you go into a dark area. Replace any light bulbs as soon as they burn out.  Set up your furniture so you have a clear path. Avoid moving your furniture around.  If any of your floors are uneven, fix them.  If there are any pets around you, be aware of where they are.  Review your medicines with your doctor. Some medicines can make you feel dizzy. This can increase your chance of falling. Ask your doctor what other things that you can do to help prevent falls. This information is not intended to replace advice given to you by your health care provider. Make sure you discuss any questions you have with your health care provider. Document Released: 02/02/2009 Document Revised: 09/14/2015 Document Reviewed: 05/13/2014 Elsevier Interactive Patient Education  2017 [employer].

## 2020-07-06 ENCOUNTER — Other Ambulatory Visit: Payer: Medicare Other

## 2020-07-19 ENCOUNTER — Other Ambulatory Visit: Payer: Self-pay | Admitting: Family Medicine

## 2020-07-19 MED ORDER — LEVOTHYROXINE SODIUM 50 MCG PO TABS: 50.0000 ug | ORAL_TABLET | Freq: Every day | ORAL | 1 refills | Status: DC

## 2020-07-19 NOTE — Telephone Encounter (Signed)
Medication Refill - Medication: levothyroxine (SYNTHROID) 50 MCG tablet     Preferred Pharmacy (with phone number or street name):  [facility] [place], [place] - [address] Phone:  [phone]  Fax:  [phone]       Agent: Please be advised that RX refills may take up to 3 business days. We ask that you follow-up with your pharmacy.

## 2020-07-19 NOTE — Telephone Encounter (Signed)
   Notes to clinic:  medication was filled by a historical provider  Review for refill   Requested Prescriptions  Pending Prescriptions Disp Refills   levothyroxine (SYNTHROID) 50 MCG tablet      Sig: Take 1 tablet (50 mcg total) by mouth daily before breakfast.      Endocrinology:  Hypothyroid Agents Failed - 07/19/2020 10:11 AM      Failed - TSH needs to be rechecked within 3 months after an abnormal result. Refill until TSH is due.      Passed - TSH in normal range and within 360 days    TSH  Date Value Ref Range Status  05/19/2020 3.210 0.450 - 4.500 uIU/mL Final          Passed - Valid encounter within last 12 months    Recent Outpatient Visits           2 months ago Paroxysmal atrial fibrillation [facility])   [facility] Bacigalupo, Marzella Schlein, MD       Future Appointments             In 3 months Bacigalupo, Marzella Schlein, MD [facility], [facility]   In 4 months Gollan, Tollie Pizza, MD [facility], [facility]

## 2020-07-20 ENCOUNTER — Ambulatory Visit
Admission: RE | Admit: 2020-07-20 | Discharge: 2020-07-20 | Disposition: A | Discharge status: Home / Self Care | Payer: Medicare Other | Source: Ambulatory Visit | Attending: Family Medicine | Admitting: Family Medicine

## 2020-07-20 ENCOUNTER — Other Ambulatory Visit: Payer: Self-pay

## 2020-07-20 DIAGNOSIS — E2839 Other primary ovarian failure: Secondary | ICD-10-CM | POA: Diagnosis not present

## 2020-07-20 DIAGNOSIS — Z780 Asymptomatic menopausal state: Secondary | ICD-10-CM | POA: Diagnosis not present

## 2020-07-20 DIAGNOSIS — M8589 Other specified disorders of bone density and structure, multiple sites: Secondary | ICD-10-CM | POA: Diagnosis not present

## 2020-07-24 ENCOUNTER — Telehealth: Payer: Self-pay

## 2020-07-24 NOTE — Telephone Encounter (Signed)
Pt. Given bone density results, verbalizes understanding.

## 2020-10-06 ENCOUNTER — Other Ambulatory Visit: Payer: Self-pay

## 2020-10-06 NOTE — Telephone Encounter (Signed)
[facility] faxed refill request for the following medications:  apixaban (ELIQUIS) 5 MG TABS tablet  atorvastatin (LIPITOR) 10 MG tablet   metoprolol tartrate (LOPRESSOR) 25 MG tablet   Please advise.

## 2020-10-09 MED ORDER — APIXABAN 5 MG PO TABS: 5.0000 mg | ORAL_TABLET | Freq: Two times a day (BID) | ORAL | 0 refills | Status: DC

## 2020-10-09 MED ORDER — METOPROLOL TARTRATE 25 MG PO TABS: 12.5000 mg | ORAL_TABLET | Freq: Two times a day (BID) | ORAL | 0 refills | Status: DC

## 2020-10-09 MED ORDER — ATORVASTATIN CALCIUM 10 MG PO TABS: 10.0000 mg | ORAL_TABLET | Freq: Every day | ORAL | 0 refills | Status: DC

## 2020-11-07 ENCOUNTER — Ambulatory Visit (INDEPENDENT_AMBULATORY_CARE_PROVIDER_SITE_OTHER): Payer: Medicare Other | Admitting: Family Medicine

## 2020-11-07 ENCOUNTER — Other Ambulatory Visit: Payer: Self-pay

## 2020-11-07 ENCOUNTER — Encounter: Payer: Self-pay | Admitting: Family Medicine

## 2020-11-07 VITALS — BP 120/60 | HR 95 | Temp 98.1°F | Resp 15 | Wt 129.1 lb

## 2020-11-07 DIAGNOSIS — Z0000 Encounter for general adult medical examination without abnormal findings: Secondary | ICD-10-CM | POA: Diagnosis not present

## 2020-11-07 DIAGNOSIS — I480 Paroxysmal atrial fibrillation: Secondary | ICD-10-CM

## 2020-11-07 DIAGNOSIS — E782 Mixed hyperlipidemia: Secondary | ICD-10-CM | POA: Diagnosis not present

## 2020-11-07 DIAGNOSIS — N3946 Mixed incontinence: Secondary | ICD-10-CM

## 2020-11-07 DIAGNOSIS — Z7901 Long term (current) use of anticoagulants: Secondary | ICD-10-CM

## 2020-11-07 DIAGNOSIS — E039 Hypothyroidism, unspecified: Secondary | ICD-10-CM | POA: Diagnosis not present

## 2020-11-07 DIAGNOSIS — D6869 Other thrombophilia: Secondary | ICD-10-CM | POA: Diagnosis not present

## 2020-11-07 DIAGNOSIS — Z23 Encounter for immunization: Secondary | ICD-10-CM | POA: Diagnosis not present

## 2020-11-07 DIAGNOSIS — N3281 Overactive bladder: Secondary | ICD-10-CM | POA: Insufficient documentation

## 2020-11-07 DIAGNOSIS — M8580 Other specified disorders of bone density and structure, unspecified site: Secondary | ICD-10-CM

## 2020-11-07 MED ORDER — OXYBUTYNIN CHLORIDE ER 5 MG PO TB24: 5.0000 mg | ORAL_TABLET | Freq: Every day | ORAL | 5 refills | Status: DC

## 2020-11-07 NOTE — Progress Notes (Signed)
Annual Physical Visit     Patient: Emma Villarreal, Female    DOB: 04/03/1941, 80 y.o.   MRN: [id number] Visit Date: 11/07/2020  Today's Provider: Shirlee Latch, MD   Chief Complaint  Patient presents with   Annual Exam   Subjective    Emma Villarreal is a 80 y.o. female who presents today for her Annual Wellness Visit. She reports consuming a general diet. The patient does not participate in regular exercise at present. She generally feels fairly well. She reports sleeping fairly well. She does not have additional problems to discuss today.  Last reported BMD 07/20/20  HPI  Urinary Incontinence  She has continuous leaking and wears a pad to prevent accidents. There has been an increase in frequency and urge. She denies waking up during the night to use the restroom. She has had worsening symptoms for about a year. She has been doing pelvic floor exercises.   Vaccines She has received 3 COVID vaccines and eligible for the 4 booster. She denies receiving the shingrix vaccine and she has had shingles 2x. In 2011 was her last recent pneumonia vaccine and amenable to receiving the 2nd shot in office today.   Labs  She hasn't had a mammogram for about 5 yrs. She reports performing frequent breast checks and notes no noticeable changes.   Patient Active Problem List   Diagnosis Date Noted   Acquired thrombophilia (HCC) 11/07/2020   Mixed stress and urge urinary incontinence 11/07/2020   Insomnia 05/02/2020   Chronic anticoagulation 05/02/2020   Osteopenia 05/02/2020   A-fib (HCC)    Hyperlipidemia    Hypothyroidism    Past Medical History:  Diagnosis Date   A-fib (HCC)    Allergy    Arthritis    Cataract    COPD (chronic obstructive pulmonary disease) (HCC)    patient reported not validated   Hyperlipidemia    Hypothyroidism    Osteopenia    Allergies  Allergen Reactions   Varenicline Rash   Molds & Smuts     Medications: Outpatient Medications Prior to Visit   Medication Sig   apixaban (ELIQUIS) 5 MG TABS tablet Take 1 tablet (5 mg total) by mouth 2 (two) times daily.   atorvastatin (LIPITOR) 10 MG tablet Take 1 tablet (10 mg total) by mouth daily. Take one tablet PO at bedtime   Cholecalciferol 50 MCG (2000 UT) TABS Take by mouth daily.   Cyanocobalamin (B-12 PO) Take by mouth daily in the afternoon.   ELDERBERRY PO Take by mouth daily at 8 pm.   levothyroxine (SYNTHROID) 50 MCG tablet Take 1 tablet (50 mcg total) by mouth daily before breakfast.   metoprolol tartrate (LOPRESSOR) 25 MG tablet Take 0.5 tablets (12.5 mg total) by mouth in the morning and at bedtime.   No facility-administered medications prior to visit.    Allergies  Allergen Reactions   Varenicline Rash   Molds & Smuts     Patient Care Team: Erasmo Downer, MD as PCP - General (Family Medicine) Mariah Milling Tollie Pizza, MD as Consulting Physician (Cardiology) Pa, [facility] (Optometry)  Review of Systems  Constitutional:  Negative for chills, fatigue and fever.  HENT:  Positive for dental problem, rhinorrhea and sinus pressure. Negative for ear pain and sore throat.   Eyes:  Negative for pain and visual disturbance.  Respiratory:  Negative for cough, chest tightness, shortness of breath and wheezing.   Cardiovascular:  Positive for leg swelling (minimal). Negative for chest pain  and palpitations.  Gastrointestinal:  Negative for abdominal pain, blood in stool, constipation, diarrhea, nausea and vomiting.  Endocrine: Positive for polyuria.  Genitourinary:  Positive for frequency and urgency. Negative for flank pain and pelvic pain.  Musculoskeletal:  Positive for neck stiffness. Negative for back pain, myalgias and neck pain.  Allergic/Immunologic: Positive for environmental allergies.  Neurological:  Negative for dizziness, seizures, syncope, weakness, light-headedness, numbness and headaches.  All other systems reviewed and are negative.      Objective     Vitals: BP 120/60   Pulse 95   Temp 98.1 F (36.7 C) (Oral)   Resp 15   Wt 129 lb 1.6 oz (58.6 kg)   SpO2 95%   BMI 23.61 kg/m    Physical Exam Vitals reviewed.  Constitutional:      General: She is not in acute distress.    Appearance: Normal appearance. She is well-developed. She is not diaphoretic.  HENT:     Head: Normocephalic and atraumatic.  Eyes:     General: No scleral icterus.    Conjunctiva/sclera: Conjunctivae normal.  Neck:     Thyroid: No thyromegaly.  Cardiovascular:     Rate and Rhythm: Normal rate and regular rhythm.     Pulses: Normal pulses.     Heart sounds: Normal heart sounds. No murmur heard. Pulmonary:     Effort: Pulmonary effort is normal. No respiratory distress.     Breath sounds: Normal breath sounds. No wheezing or rales.  Abdominal:     General: There is no distension.     Palpations: Abdomen is soft.     Tenderness: There is no abdominal tenderness.  Musculoskeletal:        General: No deformity.     Cervical back: Neck supple.     Right lower leg: No edema.     Left lower leg: No edema.  Lymphadenopathy:     Cervical: No cervical adenopathy.  Skin:    General: Skin is warm and dry.     Findings: No rash.  Neurological:     Mental Status: She is alert and oriented to person, place, and time. Mental status is at baseline.     Sensory: No sensory deficit.     Motor: No weakness.     Gait: Gait normal.  Psychiatric:        Mood and Affect: Mood normal.        Behavior: Behavior normal.        Thought Content: Thought content normal.    Most recent functional status assessment: In your present state of health, do you have any difficulty performing the following activities: 06/19/2020  Hearing? N  Vision? N  Difficulty concentrating or making decisions? N  Walking or climbing stairs? N  Dressing or bathing? N  Doing errands, shopping? N  Comment Not driving currently.  Preparing Food and eating ? N  Using the Toilet? N  In  the past six months, have you accidently leaked urine? Y  Comment Wears pads daily.  Do you have problems with loss of bowel control? N  Managing your Medications? N  Managing your Finances? N  Housekeeping or managing your Housekeeping? N   Most recent fall risk assessment: Fall Risk  06/19/2020  Falls in the past year? 0  Number falls in past yr: 0  Injury with Fall? 0    Most recent depression screenings: PHQ 2/9 Scores 06/19/2020 05/02/2020  PHQ - 2 Score 1 0  PHQ- 9 Score -  3   Most recent cognitive screening: No flowsheet data found. Most recent Audit-C alcohol use screening Alcohol Use Disorder Test (AUDIT) 06/19/2020  1. How often do you have a drink containing alcohol? 0  2. How many drinks containing alcohol do you have on a typical day when you are drinking? 0  3. How often do you have six or more drinks on one occasion? 0  AUDIT-C Score 0  Alcohol Brief Interventions/Follow-up AUDIT Score <7 follow-up not indicated   A score of 3 or more in women, and 4 or more in men indicates increased risk for alcohol abuse, EXCEPT if all of the points are from question 1   No results found for any visits on 11/07/20.  Assessment & Plan     Problem List Items Addressed This Visit       Cardiovascular and Mediastinum   A-fib (HCC)    Chronic, paroxsymal Currently in NSR Continue metop and eliquis       Relevant Orders   CBC w/Diff/Platelet     Endocrine   Hypothyroidism    Previously well controlled Continue Synthroid at current dose  Recheck TSH and adjust Synthroid as indicated         Relevant Orders   TSH     Musculoskeletal and Integument   Osteopenia    Encouraged continuing Vit D and Ca supplement         Hematopoietic and Hemostatic   Acquired thrombophilia (HCC)    From afib Continue Eliquis       Relevant Orders   CBC w/Diff/Platelet     Other   Hyperlipidemia    Reviewed last lipids scontinue statin       Relevant Orders   Basic  Metabolic Panel (BMET)   Chronic anticoagulation   Relevant Orders   CBC w/Diff/Platelet   Mixed stress and urge urinary incontinence    Longstanding and worsening Had bladder tacked up in the past (in her late 30s) Will try oxybutinin low dose Continue HEP for pelvic floor strengthening       Relevant Medications   oxybutynin (DITROPAN-XL) 5 MG 24 hr tablet   Other Visit Diagnoses     Encounter for annual physical exam    -  Primary       Annual wellness visit done today including the all of the following: Reviewed patient's Family Medical History Reviewed and updated list of patient's medical providers Assessment of cognitive impairment was done Assessed patient's functional ability Established a written schedule for health screening services Health Risk Assessent Completed and Reviewed  Exercise Activities and Dietary recommendations  Goals      Quit Smoking     Recommend to continue efforts to reduce smoking habits until no longer smoking.         Immunization History  Administered Date(s) Administered   Influenza, High Dose Seasonal PF 02/09/2013, 06/12/2015, 02/02/2016, 01/21/2019, 05/02/2020   PFIZER(Purple Top)SARS-COV-2 Vaccination 05/17/2019, 06/07/2019, 02/26/2020   Pneumococcal Polysaccharide-23 08/02/2009   Zoster, Live 08/02/2009    Health Maintenance  Topic Date Due   Zoster Vaccines- Shingrix (1 of 2) Never done   PNA vac Low Risk Adult (2 of 2 - PCV13) 08/03/2010   COVID-19 Vaccine (4 - Booster for Pfizer series) 06/25/2020   TETANUS/TDAP  06/19/2021 (Originally 12/16/1959)   INFLUENZA VACCINE  11/20/2020   DEXA SCAN  07/21/2023   HPV VACCINES  Aged Out     Discussed health benefits of physical activity, and encouraged her to engage  in regular exercise appropriate for her age and condition.     Return in about 6 months (around 05/10/2021) for chronic disease f/u.     I,Essence Turner,acting as a Neurosurgeon for Shirlee Latch, MD.,have  documented all relevant documentation on the behalf of Shirlee Latch, MD,as directed by  Shirlee Latch, MD while in the presence of Shirlee Latch, MD.  I, Shirlee Latch, MD, have reviewed all documentation for this visit. The documentation on 11/07/20 for the exam, diagnosis, procedures, and orders are all accurate and complete.   Aikam Hellickson, Marzella Schlein, MD, MPH [facility]

## 2020-11-07 NOTE — Assessment & Plan Note (Signed)
Encouraged continuing Vit D and Ca supplement

## 2020-11-07 NOTE — Assessment & Plan Note (Signed)
Longstanding and worsening Had bladder tacked up in the past (in her late 30s) Will try oxybutinin low dose Continue HEP for pelvic floor strengthening

## 2020-11-07 NOTE — Assessment & Plan Note (Signed)
From afib Continue Eliquis

## 2020-11-07 NOTE — Addendum Note (Signed)
Addended by: Fonda Kinder on: 11/07/2020 04:27 PM   Modules accepted: Orders

## 2020-11-07 NOTE — Patient Instructions (Signed)
   The CDC recommends two doses of Shingrix (the shingles vaccine) separated by 2 to 6 months for adults age 80 years and older. I recommend checking with your insurance plan regarding coverage for this vaccine.   

## 2020-11-07 NOTE — Assessment & Plan Note (Signed)
Previously well controlled Continue Synthroid at current dose  Recheck TSH and adjust Synthroid as indicated   

## 2020-11-07 NOTE — Assessment & Plan Note (Signed)
Reviewed last lipids scontinue statin

## 2020-11-07 NOTE — Assessment & Plan Note (Signed)
Chronic, paroxsymal Currently in NSR Continue metop and eliquis

## 2020-11-08 LAB — CBC WITH DIFFERENTIAL/PLATELET
Basophils Absolute: 0 10*3/uL (ref 0.0–0.2)
Basos: 1 %
EOS (ABSOLUTE): 0.3 10*3/uL (ref 0.0–0.4)
Eos: 4 %
Hematocrit: 45.5 % (ref 34.0–46.6)
Hemoglobin: 14.7 g/dL (ref 11.1–15.9)
Immature Grans (Abs): 0.1 10*3/uL (ref 0.0–0.1)
Immature Granulocytes: 1 %
Lymphocytes Absolute: 2.7 10*3/uL (ref 0.7–3.1)
Lymphs: 37 %
MCH: 31.5 pg (ref 26.6–33.0)
MCHC: 32.3 g/dL (ref 31.5–35.7)
MCV: 97 fL (ref 79–97)
Monocytes Absolute: 0.7 10*3/uL (ref 0.1–0.9)
Monocytes: 9 %
Neutrophils Absolute: 3.6 10*3/uL (ref 1.4–7.0)
Neutrophils: 48 %
Platelets: 217 10*3/uL (ref 150–450)
RBC: 4.67 x10E6/uL (ref 3.77–5.28)
RDW: 12.2 % (ref 11.7–15.4)
WBC: 7.2 10*3/uL (ref 3.4–10.8)

## 2020-11-08 LAB — BASIC METABOLIC PANEL
BUN/Creatinine Ratio: 12 (ref 12–28)
BUN: 10 mg/dL (ref 8–27)
CO2: 28 mmol/L (ref 20–29)
Calcium: 10.1 mg/dL (ref 8.7–10.3)
Chloride: 98 mmol/L (ref 96–106)
Creatinine, Ser: 0.81 mg/dL (ref 0.57–1.00)
Glucose: 93 mg/dL (ref 65–99)
Potassium: 5.4 mmol/L — ABNORMAL HIGH (ref 3.5–5.2)
Sodium: 139 mmol/L (ref 134–144)
eGFR: 74 mL/min/{1.73_m2} (ref >59)

## 2020-11-08 LAB — TSH: TSH: 1.74 u[IU]/mL (ref 0.450–4.500)

## 2020-11-10 ENCOUNTER — Telehealth: Payer: Self-pay

## 2020-11-10 DIAGNOSIS — E875 Hyperkalemia: Secondary | ICD-10-CM

## 2020-11-10 NOTE — Telephone Encounter (Signed)
-----   Message from Erasmo Downer, MD sent at 11/08/2020  9:44 AM EDT ----- Normal labs, except high potassium. Hydrate well and decrease intake of potassium containing foods. Repeat BMP in 1 month.

## 2020-11-23 DIAGNOSIS — H2513 Age-related nuclear cataract, bilateral: Secondary | ICD-10-CM | POA: Diagnosis not present

## 2020-12-03 ENCOUNTER — Other Ambulatory Visit: Payer: Self-pay | Admitting: Family Medicine

## 2020-12-03 NOTE — Telephone Encounter (Signed)
Requested Prescriptions  Pending Prescriptions Disp Refills  . ELIQUIS 5 MG TABS tablet [Pharmacy Med Name: Eliquis 5 MG Oral Tablet] 180 tablet 0    Sig: Take 1 tablet by mouth twice daily     Hematology:  Anticoagulants Passed - 12/03/2020  4:46 PM      Passed - HGB in normal range and within 360 days    Hemoglobin  Date Value Ref Range Status  11/07/2020 14.7 11.1 - 15.9 g/dL Final         Passed - PLT in normal range and within 360 days    Platelets  Date Value Ref Range Status  11/07/2020 217 150 - 450 x10E3/uL Final         Passed - HCT in normal range and within 360 days    Hematocrit  Date Value Ref Range Status  11/07/2020 45.5 34.0 - 46.6 % Final         Passed - Cr in normal range and within 360 days    Creatinine, Ser  Date Value Ref Range Status  11/07/2020 0.81 0.57 - 1.00 mg/dL Final         Passed - Valid encounter within last 12 months    Recent Outpatient Visits          3 weeks ago Encounter for annual physical exam   [facility] [place], Marzella Schlein, MD   7 months ago Paroxysmal atrial fibrillation [facility])   [facility] [place], Marzella Schlein, MD      Future Appointments            In 2 days Gollan, Tollie Pizza, MD [facility]   In 5 months [place], Marzella Schlein, MD [facility], [facility]

## 2020-12-05 ENCOUNTER — Ambulatory Visit: Payer: Medicare Other | Admitting: Cardiovascular Disease

## 2020-12-05 ENCOUNTER — Other Ambulatory Visit: Payer: Self-pay

## 2020-12-05 ENCOUNTER — Encounter: Payer: Self-pay | Admitting: Cardiovascular Disease

## 2020-12-05 VITALS — BP 124/60 | HR 64 | Ht 62.0 in | Wt 128.2 lb

## 2020-12-05 DIAGNOSIS — F17200 Nicotine dependence, unspecified, uncomplicated: Secondary | ICD-10-CM

## 2020-12-05 DIAGNOSIS — R0789 Other chest pain: Secondary | ICD-10-CM | POA: Diagnosis not present

## 2020-12-05 DIAGNOSIS — I480 Paroxysmal atrial fibrillation: Secondary | ICD-10-CM

## 2020-12-05 DIAGNOSIS — E782 Mixed hyperlipidemia: Secondary | ICD-10-CM | POA: Diagnosis not present

## 2020-12-05 DIAGNOSIS — E875 Hyperkalemia: Secondary | ICD-10-CM | POA: Diagnosis not present

## 2020-12-05 MED ORDER — APIXABAN 5 MG PO TABS: 5.0000 mg | ORAL_TABLET | Freq: Two times a day (BID) | ORAL | 3 refills | Status: DC

## 2020-12-05 MED ORDER — METOPROLOL SUCCINATE ER 25 MG PO TB24: 25.0000 mg | ORAL_TABLET | Freq: Every day | ORAL | 3 refills | Status: DC

## 2020-12-05 MED ORDER — ATORVASTATIN CALCIUM 10 MG PO TABS: 10.0000 mg | ORAL_TABLET | Freq: Every day | ORAL | 3 refills | Status: DC

## 2020-12-05 MED ORDER — METOPROLOL TARTRATE 25 MG PO TABS
12.5000 mg | ORAL_TABLET | ORAL | 3 refills | Status: DC | PRN
Start: 2020-12-05 — End: 2021-11-15

## 2020-12-05 NOTE — Patient Instructions (Addendum)
Medication Instructions:  Please START metoprolol succinate 25 mg daily  Take metoprolol tartrate as needed for breakthrough tachycardia  If you need a refill on your cardiac medications before your next appointment, please call your pharmacy.    Lab work: No new labs needed  Testing/Procedures: No new testing needed   Follow-Up: At [facility], you and your health needs are our priority.  As part of our continuing mission to provide you with exceptional heart care, we have created designated Provider Care Teams.  These Care Teams include your primary Cardiologist (physician) and Advanced Practice Providers (APPs -  Physician Assistants and Nurse Practitioners) who all work together to provide you with the care you need, when you need it.  You will need a follow up appointment in 12 months  Providers on your designated Care Team:   Nicolasa Ducking, NP Eula Listen, PA-C Marisue Ivan, PA-C [place], [place]  COVID-19 Vaccine Information can be found at: [email or web address] For questions related to vaccine distribution or appointments, please email [email or web address] or call [phone].

## 2020-12-05 NOTE — Progress Notes (Signed)
Cardiology Office Note  Date:  12/05/2020   ID:  Emma Villarreal, DOB [birth year], MRN [id number]  PCP:  Emma Downer, MD   Chief Complaint  Patient presents with   6 month follow up     Patient c/o rapid heartbeats at times. Medications reviewed by the patient verbally.     HPI:  [name] is a 80 year old woman with past medical history of Atrial fibrillation, cardioversion 2017 Hyperlipidemia Smoker 3pp week Presenting  for follow-up of her atrial fibrillation  In follow-up today she reports that she feels well, has minimal palpitations Still smoking No hospital visits Lives with daughter, does not drive She would like to buy a car and start driving again  No regular exercise program Does not feel that her legs are getting weak  Denies any tachypalpitations concerning for atrial fibrillation  EKG personally reviewed by myself on todays visit nsr rate 64, T wave ABN inferior leads II, III, aVF  Past medical history reviewed Seen in the hospital in 2017 for atrial fibrillation,  underwent cardioversion, started on Eliquis metoprolol  Echocardiogram 2019, told she had a mild leak of the valve Stress test September 2019   PMH:   has a past medical history of A-fib (HCC), Allergy, Arthritis, Cataract, COPD (chronic obstructive pulmonary disease) (HCC), Hyperlipidemia, Hypothyroidism, and Osteopenia.  PSH:    Past Surgical History:  Procedure Laterality Date   CHOLECYSTECTOMY     VAGINAL HYSTERECTOMY      Current Outpatient Medications  Medication Sig Dispense Refill   atorvastatin (LIPITOR) 10 MG tablet Take 1 tablet (10 mg total) by mouth daily. Take one tablet PO at bedtime 90 tablet 0   Cholecalciferol 50 MCG (2000 UT) TABS Take by mouth daily.     Cyanocobalamin (B-12 PO) Take by mouth daily in the afternoon.     ELDERBERRY PO Take by mouth daily at 8 pm.     ELIQUIS 5 MG TABS tablet Take 1 tablet by mouth twice daily 180 tablet 1   levothyroxine  (SYNTHROID) 50 MCG tablet Take 1 tablet (50 mcg total) by mouth daily before breakfast. 90 tablet 1   metoprolol tartrate (LOPRESSOR) 25 MG tablet Take 0.5 tablets (12.5 mg total) by mouth in the morning and at bedtime. 180 tablet 0   oxybutynin (DITROPAN-XL) 5 MG 24 hr tablet Take 1 tablet (5 mg total) by mouth at bedtime. (Patient not taking: Reported on 12/05/2020) 30 tablet 5   No current facility-administered medications for this visit.    Allergies:   Varenicline and Molds & smuts   Social History:  The patient  reports that she has been smoking cigarettes. She has a 60.00 pack-year smoking history. She has never used smokeless tobacco. She reports that she does not currently use alcohol. She reports that she does not use drugs.   Family History:   family history includes Colon cancer in her maternal aunt; Dementia in her mother; Diabetes in her daughter and daughter; Stroke in her maternal grandmother and mother.    Review of Systems: Review of Systems  Constitutional: Negative.   HENT: Negative.    Respiratory: Negative.    Cardiovascular:  Positive for palpitations.  Gastrointestinal: Negative.   Musculoskeletal: Negative.   Neurological: Negative.   Psychiatric/Behavioral: Negative.    All other systems reviewed and are negative.  PHYSICAL EXAM: VS:  BP 124/60 (BP Location: Left Arm, Patient Position: Sitting, Cuff Size: Normal)   Pulse 64   Ht 5\' 2"  (1.575 m)  Wt 128 lb 4 oz (58.2 kg)   SpO2 95%   BMI 23.46 kg/m  , BMI Body mass index is 23.46 kg/m. Constitutional:  oriented to person, place, and time. No distress.  HENT:  Head: Grossly normal Eyes:  no discharge. No scleral icterus.  Neck: No JVD, no carotid bruits  Cardiovascular: Regular rate and rhythm, no murmurs appreciated Pulmonary/Chest: Clear to auscultation bilaterally, no wheezes or rails Abdominal: Soft.  no distension.  no tenderness.  Musculoskeletal: Normal range of motion Neurological:  normal  muscle tone. Coordination normal. No atrophy Skin: Skin warm and dry Psychiatric: normal affect, pleasant   Recent Labs: 05/19/2020: ALT 29 11/07/2020: BUN 10; Creatinine, Ser 0.81; Hemoglobin 14.7; Platelets 217; Potassium 5.4; Sodium 139; TSH 1.740    Lipid Panel Lab Results  Component Value Date   CHOL 147 05/19/2020   HDL 63 05/19/2020   LDLCALC 59 05/19/2020   TRIG 149 05/19/2020      Wt Readings from Last 3 Encounters:  12/05/20 128 lb 4 oz (58.2 kg)  11/07/20 129 lb 1.6 oz (58.6 kg)  06/06/20 132 lb 6 oz (60 kg)     ASSESSMENT AND PLAN:  Problem List Items Addressed This Visit       Cardiology Problems   A-fib [facility]) - Primary   Relevant Orders   EKG 12-Lead   Hyperlipidemia   Relevant Orders   EKG 12-Lead   Other Visit Diagnoses     Smoker       Chest tightness         Paroxysmal atrial fibrillation On Eliquis, no bleeding, no falls NSR, will change to metoprolol succinate 25 daily Metoprolol tartrate PRN  Smoker We have encouraged her to continue to work on weaning her cigarettes and smoking cessation. She will continue to work on this and does not want any assistance with chantix.   Cutting down  Hyperlipidemia Cholesterol is at goal on the current lipid regimen. No changes to the medications were made.  Cardiac risk factors High risk of coronary disease given long smoking history, hyperlipidemia No angina   Total encounter time more than 25 minutes  Greater than 50% was spent in counseling and coordination of care with the patient    Signed, Dossie Arbour, M.D., Ph.D. [facility] [place], [place] [phone]

## 2020-12-06 LAB — BASIC METABOLIC PANEL
BUN/Creatinine Ratio: 14 (ref 12–28)
BUN: 11 mg/dL (ref 8–27)
CO2: 28 mmol/L (ref 20–29)
Calcium: 9.6 mg/dL (ref 8.7–10.3)
Chloride: 97 mmol/L (ref 96–106)
Creatinine, Ser: 0.78 mg/dL (ref 0.57–1.00)
Glucose: 94 mg/dL (ref 65–99)
Potassium: 4.6 mmol/L (ref 3.5–5.2)
Sodium: 137 mmol/L (ref 134–144)
eGFR: 77 mL/min/{1.73_m2} (ref >59)

## 2021-02-01 ENCOUNTER — Telehealth: Payer: Self-pay | Admitting: Cardiovascular Disease

## 2021-02-01 NOTE — Telephone Encounter (Signed)
   [facility] Pre-operative Risk Assessment    Patient Name: Emma Villarreal  DOB: 1940-08-18 MRN: [id number]  HEARTCARE STAFF:  - IMPORTANT!!!!!! Under Visit Info/Reason for Call, type in Other and utilize the format Clearance MM/DD/YY or Clearance TBD. Do not use dashes or single digits. - Please review there is not already an duplicate clearance open for this procedure. - If request is for dental extraction, please clarify the # of teeth to be extracted. - If the patient is currently at the dentist's office, call Pre-Op Callback Staff (MA/nurse) to input urgent request.  - If the patient is not currently in the dentist office, please route to the Pre-Op pool.  Request for surgical clearance:  What type of surgery is being performed? 8 EXTRACTIONS (DENTAL),    When is this surgery scheduled? TBD  What type of clearance is required (medical clearance vs. Pharmacy clearance to hold med vs. Both)? BOTH  Are there any medications that need to be held prior to surgery and how long? YES, BLOOD THINNER  Practice name and name of physician performing surgery? [facility], DR Ron Agee  What is the office phone number? NOT LISTED   7.   What is the office fax number? [phone]  8.   Anesthesia type (None, local, MAC, general) LOCAL ANESTHETIC WITH EPINEPHRINE, NITROUS OXIDE   Emma Villarreal L Emma Villarreal 02/01/2021, 7:24 AM  _________________________________________________________________   (provider comments below)

## 2021-02-02 NOTE — Telephone Encounter (Signed)
   Primary Cardiologist: Julien Nordmann, MD  Chart reviewed as part of pre-operative protocol coverage. Given past medical history and time since last visit, based on ACC/AHA guidelines, Emma Villarreal would be at acceptable risk for the planned procedure without further cardiovascular testing.   Patient with diagnosis of afib on Eliquis for anticoagulation.     Procedure: 8 dental extractions Date of procedure: TBD   CHA2DS2-VASc Score = 3  This indicates a 3.2% annual risk of stroke. The patient's score is based upon: CHF History: 0 HTN History: 0 Diabetes History: 0 Stroke History: 0 Vascular Disease History: 0 Age Score: 2 Gender Score: 1    CrCl 25mL/min Platelet count 217K   Patient does not require pre-op antibiotics for dental procedure.   Per office protocol, patient can hold Eliquis for 2 days prior to procedure.  I will route this recommendation to the requesting party via Epic fax function and remove from pre-op pool.  Please call with questions.  Thomasene Ripple. Brooke Payes NP-C    02/02/2021, 11:27 AM [facility] [address] Office [phone] Fax [phone]

## 2021-02-02 NOTE — Telephone Encounter (Signed)
Patient with diagnosis of afib on Eliquis for anticoagulation.    Procedure: 8 dental extractions Date of procedure: TBD  CHA2DS2-VASc Score = 3  This indicates a 3.2% annual risk of stroke. The patient's score is based upon: CHF History: 0 HTN History: 0 Diabetes History: 0 Stroke History: 0 Vascular Disease History: 0 Age Score: 2 Gender Score: 1   CrCl 61mL/min Platelet count 217K  Patient does not require pre-op antibiotics for dental procedure.  Per office protocol, patient can hold Eliquis for 2 days prior to procedure.

## 2021-04-24 ENCOUNTER — Other Ambulatory Visit: Payer: Self-pay | Admitting: Family Medicine

## 2021-05-01 ENCOUNTER — Telehealth: Payer: Self-pay

## 2021-05-01 MED ORDER — LEVOTHYROXINE SODIUM 50 MCG PO TABS: ORAL_TABLET | ORAL | 0 refills | Status: DC

## 2021-05-01 NOTE — Telephone Encounter (Signed)
Refilled again.

## 2021-05-01 NOTE — Addendum Note (Signed)
Addended by: Marjie Skiff on: 05/01/2021 02:01 PM   Modules accepted: Orders

## 2021-05-01 NOTE — Telephone Encounter (Signed)
Copied from CRM [id number]. Topic: General - Other >> May 01, 2021 12:34 PM Traci Sermon wrote: Reason for CRM: Pt states the pharmacy did not receive her levothyroxine (SYNTHROID) 50 MCG tablet medication, and was wanting to get it sent back over, or for PCP to reach out to pharmacy, please advise.

## 2021-05-07 DIAGNOSIS — H43813 Vitreous degeneration, bilateral: Secondary | ICD-10-CM | POA: Diagnosis not present

## 2021-05-08 NOTE — Progress Notes (Signed)
Established patient visit   Patient: Emma Villarreal   DOB: Oct 30, 1940   81 y.o. Female  MRN: [id number] Visit Date: 05/10/2021  Today's healthcare provider: Lavon Paganini, MD   Chief Complaint  Patient presents with   Hypothyroidism   Hyperlipidemia   Follow-up   Subjective    HPI  Hypothyroid, follow-up  Lab Results  Component Value Date   TSH 1.740 11/07/2020   TSH 3.210 05/19/2020    Wt Readings from Last 3 Encounters:  05/10/21 133 lb (60.3 kg)  12/05/20 128 lb 4 oz (58.2 kg)  11/07/20 129 lb 1.6 oz (58.6 kg)    She was last seen for hypothyroid 6 months ago.  Management since that visit includes no changes. She reports excellent compliance with treatment. She is not having side effects.   Symptoms: No change in energy level No constipation  No diarrhea No heat / cold intolerance  No nervousness No palpitations  No weight changes    ----------------------------------------------------------------------------------------- Lipid/Cholesterol, Follow-up  Last lipid panel Other pertinent labs  Lab Results  Component Value Date   CHOL 147 05/19/2020   HDL 63 05/19/2020   LDLCALC 59 05/19/2020   TRIG 149 05/19/2020   CHOLHDL 2.3 05/19/2020   Lab Results  Component Value Date   ALT 29 05/19/2020   AST 27 05/19/2020   PLT 217 11/07/2020   TSH 1.740 11/07/2020     She was last seen for this 6 months ago.  Management since that visit includes no changes.  She reports excellent compliance with treatment. She is not having side effects.   Symptoms: No chest pain No chest pressure/discomfort  No dyspnea Yes lower extremity edema  No numbness or tingling of extremity No orthopnea  No palpitations No paroxysmal nocturnal dyspnea  No speech difficulty No syncope   Current diet: well balanced, soft diet Current exercise: walking  The ASCVD Risk score (Arnett DK, et al., 2019) failed to calculate for the following reasons:   The 2019 ASCVD risk  score is only valid for ages 39 to 2  --------------------------------------------------------------------------------------------------- Follow up for urinary incontinence  The patient was last seen for this 6 months ago. Changes made at last visit include start low dose oxybutinin.  She reports excellent compliance with treatment. She feels that condition is Improved. She is not having side effects.   ----------------------------------------------------------------------------------------- R LE swelling on and off for months.  Goes down over night. No pain. Trying to walk more. Denies any in the LLE. No signs of DVT, no swelling today, suspect venous stasis - encourage elevation and compression  Medications: Outpatient Medications Prior to Visit  Medication Sig   apixaban (ELIQUIS) 5 MG TABS tablet Take 1 tablet (5 mg total) by mouth 2 (two) times daily.   atorvastatin (LIPITOR) 10 MG tablet Take 1 tablet (10 mg total) by mouth daily. Take one tablet PO at bedtime   BINAXNOW COVID-19 AG HOME TEST KIT Use as Directed on the Package   Cholecalciferol 50 MCG (2000 UT) TABS Take by mouth daily.   Cyanocobalamin (B-12 PO) Take by mouth daily in the afternoon.   ELDERBERRY PO Take by mouth daily at 8 pm.   levothyroxine (SYNTHROID) 50 MCG tablet TAKE 1 TABLET BY MOUTH BEFORE BREAKFAST   metoprolol succinate (TOPROL XL) 25 MG 24 hr tablet Take 1 tablet (25 mg total) by mouth daily.   oxybutynin (DITROPAN-XL) 5 MG 24 hr tablet Take 1 tablet (5 mg total) by mouth at bedtime.  metoprolol tartrate (LOPRESSOR) 25 MG tablet Take 0.5 tablets (12.5 mg total) by mouth as needed. Take for tachycardiac (Patient not taking: Reported on 05/10/2021)   No facility-administered medications prior to visit.    Review of Systems per HPI       Objective    BP (!) 119/57 (BP Location: Left Arm, Patient Position: Sitting, Cuff Size: Normal)    Pulse 60    Temp 98.1 F (36.7 C) (Oral)    Ht _0  (1.575  m)    Wt 133 lb (60.3 kg)    SpO2 97%    BMI 24.33 kg/m  BP Readings from Last 3 Encounters:  05/10/21 (!) 119/57  12/05/20 124/60  11/07/20 120/60   Wt Readings from Last 3 Encounters:  05/10/21 133 lb (60.3 kg)  12/05/20 128 lb 4 oz (58.2 kg)  11/07/20 129 lb 1.6 oz (58.6 kg)      Physical Exam Vitals reviewed.  Constitutional:      General: She is not in acute distress.    Appearance: Normal appearance. She is well-developed. She is not diaphoretic.  HENT:     Head: Normocephalic and atraumatic.  Eyes:     General: No scleral icterus.    Conjunctiva/sclera: Conjunctivae normal.  Neck:     Thyroid: No thyromegaly.  Cardiovascular:     Rate and Rhythm: Normal rate and regular rhythm.     Pulses: Normal pulses.     Heart sounds: Normal heart sounds. No murmur heard. Pulmonary:     Effort: Pulmonary effort is normal. No respiratory distress.     Breath sounds: Normal breath sounds. No wheezing, rhonchi or rales.  Musculoskeletal:     Cervical back: Neck supple.     Right lower leg: No edema.     Left lower leg: No edema.  Lymphadenopathy:     Cervical: No cervical adenopathy.  Skin:    General: Skin is warm and dry.     Findings: No rash.  Neurological:     Mental Status: She is alert and oriented to person, place, and time. Mental status is at baseline.  Psychiatric:        Mood and Affect: Mood normal.        Behavior: Behavior normal.      No results found for any visits on 05/10/21.  Assessment & Plan     Problem List Items Addressed This Visit       Cardiovascular and Mediastinum   A-fib ([facility])    Chronic, paroxysmal Currently in normal sinus rhythm Continue metoprolol and Eliquis      Relevant Orders   CBC     Endocrine   Hypothyroidism - Primary    Previously well controlled Continue Synthroid at current dose  Recheck TSH and adjust Synthroid as indicated        Relevant Orders   TSH     Hematopoietic and Hemostatic   Acquired  thrombophilia ([place])    From A. fib Continue Eliquis Recheck CBC      Relevant Orders   CBC     Other   Hyperlipidemia    Previously well controlled Continue statin Repeat FLP and CMP      Relevant Orders   Comprehensive metabolic panel   Lipid panel   Mixed stress and urge urinary incontinence    Chronic and improved Continue oxybutynin at current dose  continue home exercise program for pelvic floor strengthening        Return in about 6  months (around 11/07/2021) for CPE, AWV.      I, Lavon Paganini, MD, have reviewed all documentation for this visit. The documentation on 05/10/21 for the exam, diagnosis, procedures, and orders are all accurate and complete.   Breyon Blass, Dionne Bucy, MD, MPH [facility]

## 2021-05-10 ENCOUNTER — Other Ambulatory Visit: Payer: Self-pay

## 2021-05-10 ENCOUNTER — Encounter: Payer: Self-pay | Admitting: Family Medicine

## 2021-05-10 ENCOUNTER — Ambulatory Visit (INDEPENDENT_AMBULATORY_CARE_PROVIDER_SITE_OTHER): Payer: Medicare Other | Admitting: Family Medicine

## 2021-05-10 VITALS — BP 119/57 | HR 60 | Temp 98.1°F | Ht 62.0 in | Wt 133.0 lb

## 2021-05-10 DIAGNOSIS — E039 Hypothyroidism, unspecified: Secondary | ICD-10-CM

## 2021-05-10 DIAGNOSIS — N3946 Mixed incontinence: Secondary | ICD-10-CM

## 2021-05-10 DIAGNOSIS — E782 Mixed hyperlipidemia: Secondary | ICD-10-CM | POA: Diagnosis not present

## 2021-05-10 DIAGNOSIS — I480 Paroxysmal atrial fibrillation: Secondary | ICD-10-CM | POA: Diagnosis not present

## 2021-05-10 DIAGNOSIS — D6869 Other thrombophilia: Secondary | ICD-10-CM

## 2021-05-10 NOTE — Assessment & Plan Note (Signed)
Previously well controlled Continue statin Repeat FLP and CMP  

## 2021-05-10 NOTE — Assessment & Plan Note (Signed)
Chronic and improved Continue oxybutynin at current dose  continue home exercise program for pelvic floor strengthening

## 2021-05-10 NOTE — Assessment & Plan Note (Signed)
Chronic, paroxysmal Currently in normal sinus rhythm Continue metoprolol and Eliquis

## 2021-05-10 NOTE — Assessment & Plan Note (Signed)
Previously well controlled Continue Synthroid at current dose  Recheck TSH and adjust Synthroid as indicated   

## 2021-05-10 NOTE — Assessment & Plan Note (Signed)
From A. fib Continue Eliquis Recheck CBC

## 2021-05-18 DIAGNOSIS — D6869 Other thrombophilia: Secondary | ICD-10-CM | POA: Diagnosis not present

## 2021-05-18 DIAGNOSIS — E782 Mixed hyperlipidemia: Secondary | ICD-10-CM | POA: Diagnosis not present

## 2021-05-18 DIAGNOSIS — E039 Hypothyroidism, unspecified: Secondary | ICD-10-CM | POA: Diagnosis not present

## 2021-05-18 DIAGNOSIS — I480 Paroxysmal atrial fibrillation: Secondary | ICD-10-CM | POA: Diagnosis not present

## 2021-05-19 LAB — COMPREHENSIVE METABOLIC PANEL
ALT: 25 IU/L (ref 0–32)
AST: 24 IU/L (ref 0–40)
Albumin/Globulin Ratio: 1.9 (ref 1.2–2.2)
Albumin: 4.7 g/dL (ref 3.7–4.7)
Alkaline Phosphatase: 67 IU/L (ref 44–121)
BUN/Creatinine Ratio: 9 — ABNORMAL LOW (ref 12–28)
BUN: 8 mg/dL (ref 8–27)
Bilirubin Total: 0.5 mg/dL (ref 0.0–1.2)
CO2: 28 mmol/L (ref 20–29)
Calcium: 9.8 mg/dL (ref 8.7–10.3)
Chloride: 101 mmol/L (ref 96–106)
Creatinine, Ser: 0.88 mg/dL (ref 0.57–1.00)
Globulin, Total: 2.5 g/dL (ref 1.5–4.5)
Glucose: 100 mg/dL — ABNORMAL HIGH (ref 70–99)
Potassium: 4.8 mmol/L (ref 3.5–5.2)
Sodium: 140 mmol/L (ref 134–144)
Total Protein: 7.2 g/dL (ref 6.0–8.5)
eGFR: 66 mL/min/{1.73_m2} (ref >59)

## 2021-05-19 LAB — LIPID PANEL
Chol/HDL Ratio: 2 ratio (ref 0.0–4.4)
Cholesterol, Total: 129 mg/dL (ref 100–199)
HDL: 63 mg/dL (ref >39)
LDL Chol Calc (NIH): 44 mg/dL (ref 0–99)
Triglycerides: 129 mg/dL (ref 0–149)
VLDL Cholesterol Cal: 22 mg/dL (ref 5–40)

## 2021-05-19 LAB — TSH: TSH: 3.83 u[IU]/mL (ref 0.450–4.500)

## 2021-05-19 LAB — CBC
Hematocrit: 43.3 % (ref 34.0–46.6)
Hemoglobin: 13.9 g/dL (ref 11.1–15.9)
MCH: 31.9 pg (ref 26.6–33.0)
MCHC: 32.1 g/dL (ref 31.5–35.7)
MCV: 99 fL — ABNORMAL HIGH (ref 79–97)
Platelets: 224 10*3/uL (ref 150–450)
RBC: 4.36 x10E6/uL (ref 3.77–5.28)
RDW: 12.3 % (ref 11.7–15.4)
WBC: 6.9 10*3/uL (ref 3.4–10.8)

## 2021-06-07 ENCOUNTER — Other Ambulatory Visit: Payer: Self-pay | Admitting: Family Medicine

## 2021-06-25 ENCOUNTER — Ambulatory Visit (INDEPENDENT_AMBULATORY_CARE_PROVIDER_SITE_OTHER): Payer: Medicare Other

## 2021-06-25 VITALS — Ht 62.0 in | Wt 134.0 lb

## 2021-06-25 DIAGNOSIS — Z0000 Encounter for general adult medical examination without abnormal findings: Secondary | ICD-10-CM

## 2021-06-25 NOTE — Progress Notes (Signed)
°Virtual Visit via Telephone Note ° °I connected with  Emma Villarreal on 06/25/21 at 11:00 AM EST by telephone and verified that I am speaking with the correct person using two identifiers. ° °Location: °Patient: home °Provider: [facility] °Persons participating in the virtual visit: patient/Nurse Health Advisor °  °I discussed the limitations, risks, security and privacy concerns of performing an evaluation and management service by telephone and the availability of in person appointments. The patient expressed understanding and agreed to proceed. ° °Interactive audio and video telecommunications were attempted between this nurse and patient, however failed, due to patient having technical difficulties OR patient did not have access to video capability.  We continued and completed visit with audio only. ° °Some vital signs may be absent or patient reported.  ° °Lorrie S Barnes, LPN ° °Subjective:  ° Emma Villarreal is a 81 y.o. female who presents for Medicare Annual (Subsequent) preventive examination. ° °Review of Systems    ° °  ° °   °Objective:  °  °Today's Vitals  ° 06/25/21 1101  °Weight: 134 lb (60.8 kg)  °Height: 5' 2" (1.575 m)  ° °Body mass index is 24.51 kg/m². ° °Advanced Directives 06/19/2020  °Does Patient Have a Medical Advance Directive? Yes  °Type of Advance Directive Healthcare Power of Attorney;Living will  °Copy of Healthcare Power of Attorney in Chart? No - copy requested  ° ° °Current Medications (verified) °Outpatient Encounter Medications as of 06/25/2021  °Medication Sig  ° atorvastatin (LIPITOR) 10 MG tablet Take 1 tablet (10 mg total) by mouth daily. Take one tablet PO at bedtime  ° BINAXNOW COVID-19 AG HOME TEST KIT Use as Directed on the Package  ° Cholecalciferol 50 MCG (2000 UT) TABS Take by mouth daily.  ° Cyanocobalamin (B-12 PO) Take by mouth daily in the afternoon.  ° ELDERBERRY PO Take by mouth daily at 8 pm.  ° ELIQUIS 5 MG TABS tablet Take 1 tablet by mouth twice daily  ° levothyroxine  (SYNTHROID) 50 MCG tablet TAKE 1 TABLET BY MOUTH BEFORE BREAKFAST  ° metoprolol succinate (TOPROL XL) 25 MG 24 hr tablet Take 1 tablet (25 mg total) by mouth daily.  ° metoprolol tartrate (LOPRESSOR) 25 MG tablet Take 0.5 tablets (12.5 mg total) by mouth as needed. Take for tachycardiac (Patient not taking: Reported on 05/10/2021)  ° oxybutynin (DITROPAN-XL) 5 MG 24 hr tablet Take 1 tablet (5 mg total) by mouth at bedtime.  ° °No facility-administered encounter medications on file as of 06/25/2021.  ° ° °Allergies (verified) °Varenicline and Molds & smuts  ° °History: °Past Medical History:  °Diagnosis Date  ° A-fib ([facility])   ° Allergy   ° Arthritis   ° Cataract   ° COPD (chronic obstructive pulmonary disease) ([facility])   ° patient reported not validated  ° Hyperlipidemia   ° Hypothyroidism   ° Osteopenia   ° °Past Surgical History:  °Procedure Laterality Date  ° CHOLECYSTECTOMY    ° VAGINAL HYSTERECTOMY    ° °Family History  °Problem Relation Age of Onset  ° Diabetes Daughter   ° Stroke Maternal Grandmother   ° Stroke Mother   ° Dementia Mother   ° Diabetes Daughter   ° Colon cancer Maternal Aunt   ° Breast cancer Neg Hx   ° °Social History  ° °Socioeconomic History  ° Marital status: Widowed  °  Spouse name: Not on file  ° Number of children: 5  ° Years of education: Not on file  ° Highest   Highest education level: High school graduate  Occupational History   Occupation: retired    Comment: Glass blower/designer   Tobacco Use   Smoking status: Every Day    Packs/day: 1.00    Years: 60.00    Pack years: 60.00    Types: Cigarettes   Smokeless tobacco: Never  Vaping Use   Vaping Use: Never used  Substance and Sexual Activity   Alcohol use: Not Currently   Drug use: Never   Sexual activity: Not Currently  Other Topics Concern   Not on file  Social History Narrative   Not on file   Social Determinants of Health   Financial Resource Strain: Not on file  Food Insecurity: Not on file  Transportation Needs: Not on file   Physical Activity: Not on file  Stress: Not on file  Social Connections: Not on file    Tobacco Counseling Ready to quit: Not Answered Counseling given: Not Answered   Clinical Intake:  Pre-visit preparation completed: Yes  Pain : No/denies pain     Nutritional Risks: None Diabetes: No  How often do you need to have someone help you when you read instructions, pamphlets, or other written materials from your doctor or pharmacy?: 1 - Never  Diabetic?no  Interpreter Needed?: No  Information entered by :: Kirke Shaggy, LPN   Activities of Daily Living No flowsheet data found.  Patient Care Team: Virginia Crews, MD as PCP - General (Family Medicine) Rockey Situ Kathlene November, MD as PCP - Cardiology (Cardiology) Minna Merritts, MD as Consulting Physician (Cardiology) [facility], [place] (Optometry)  Indicate any recent Medical Services you may have received from other than [facility] providers in the past year (date may be approximate).     Assessment:   This is a routine wellness examination for Emma Villarreal.  Hearing/Vision screen No results found.  Dietary issues and exercise activities discussed:     Goals Addressed   None    Depression Screen PHQ 2/9 Scores 06/19/2020 05/02/2020  PHQ - 2 Score 1 0  PHQ- 9 Score - 3    Fall Risk Fall Risk  06/19/2020 05/02/2020  Falls in the past year? 0 0  Number falls in past yr: 0 0  Injury with Fall? 0 0    FALL RISK PREVENTION PERTAINING TO THE HOME:  Any stairs in or around the home? No  If so, are there any without handrails? No  Home free of loose throw rugs in walkways, pet beds, electrical cords, etc? Yes  Adequate lighting in your home to reduce risk of falls? Yes   ASSISTIVE DEVICES UTILIZED TO PREVENT FALLS:  Life alert? No  Use of a cane, walker or w/c? No  Grab bars in the bathroom? Yes  Shower chair or bench in shower? No  Elevated toilet seat or a handicapped toilet? No   Cognitive Function:Normal  cognitive status assessed by direct observation by this Nurse Health Advisor. No abnormalities found.          Immunizations Immunization History  Administered Date(s) Administered   Influenza, High Dose Seasonal PF 02/09/2013, 06/12/2015, 02/02/2016, 01/21/2019, 05/02/2020   PFIZER(Purple Top)SARS-COV-2 Vaccination 05/17/2019, 06/07/2019, 02/26/2020   PNEUMOCOCCAL CONJUGATE-20 11/07/2020   Pneumococcal Polysaccharide-23 08/02/2009   Zoster, Live 08/02/2009    TDAP status: Due, Education has been provided regarding the importance of this vaccine. Advised may receive this vaccine at local pharmacy or Health Dept. Aware to provide a copy of the vaccination record if obtained from local pharmacy or  Dept. Verbalized acceptance and understanding. ° °Flu Vaccine status: Up to date ° °Pneumococcal vaccine status: Up to date ° °Covid-19 vaccine status: Completed vaccines ° °Qualifies for Shingles Vaccine? Yes   °Zostavax completed No   °Shingrix Completed?: No.    Education has been provided regarding the importance of this vaccine. Patient has been advised to call insurance company to determine out of pocket expense if they have not yet received this vaccine. Advised may also receive vaccine at local pharmacy or Health Dept. Verbalized acceptance and understanding. ° °Screening Tests °Health Maintenance  °Topic Date Due  ° TETANUS/TDAP  Never done  ° Zoster Vaccines- Shingrix (1 of 2) Never done  ° COVID-19 Vaccine (4 - Booster for Pfizer series) 04/22/2020  ° INFLUENZA VACCINE  07/20/2021 (Originally 11/20/2020)  ° DEXA SCAN  07/21/2023  ° Pneumonia Vaccine 65+ Years old  Completed  ° HPV VACCINES  Aged Out  ° ° °Health Maintenance ° °Health Maintenance Due  °Topic Date Due  ° TETANUS/TDAP  Never done  ° Zoster Vaccines- Shingrix (1 of 2) Never done  ° COVID-19 Vaccine (4 - Booster for Pfizer series) 04/22/2020  ° ° °Colorectal cancer screening: No longer required.  ° °Mammogram status: No longer  required due to age. ° °Bone Density status: Completed 07/20/20. Results reflect: Bone density results: OSTEOPENIA. Repeat every 5 years. ° °Lung Cancer Screening: (Low Dose CT Chest recommended if Age 55-80 years, 30 pack-year currently smoking OR have quit w/in 15years.) does not qualify.  ° °Additional Screening: ° °Hepatitis C Screening: does not qualify; Completed no ° °Vision Screening: Recommended annual ophthalmology exams for early detection of glaucoma and other disorders of the eye. °Is the patient up to date with their annual eye exam?  Yes  °Who is the provider or what is the name of the office in which the patient attends annual eye exams? [facility] °If pt is not established with a provider, would they like to be referred to a provider to establish care? No .  ° °Dental Screening: Recommended annual dental exams for proper oral hygiene ° °Community Resource Referral / Chronic Care Management: °CRR required this visit?  No  ° °CCM required this visit?  No  ° ° °  °Plan:  °  ° °I have personally reviewed and noted the following in the patient’s chart:  ° °Medical and social history °Use of alcohol, tobacco or illicit drugs  °Current medications and supplements including opioid prescriptions.  °Functional ability and status °Nutritional status °Physical activity °Advanced directives °List of other physicians °Hospitalizations, surgeries, and ER visits in previous 12 months °Vitals °Screenings to include cognitive, depression, and falls °Referrals and appointments ° °In addition, I have reviewed and discussed with patient certain preventive protocols, quality metrics, and best practice recommendations. A written personalized care plan for preventive services as well as general preventive health recommendations were provided to patient. °  ° ° °Lorrie S Barnes, LPN   06/25/2021  ° °Nurse Notes: none ° ° ° ° ° °

## 2021-06-25 NOTE — Patient Instructions (Signed)
Emma Villarreal , Thank you for taking time to come for your Medicare Wellness Visit. I appreciate your ongoing commitment to your health goals. Please review the following plan we discussed and let me know if I can assist you in the future.   Screening recommendations/referrals: Colonoscopy: aged out Mammogram: aged out Bone Density: 07/20/20 Recommended yearly ophthalmology/optometry visit for glaucoma screening and checkup Recommended yearly dental visit for hygiene and checkup  Vaccinations: Influenza vaccine: 05/02/20 Pneumococcal vaccine: 11/07/20 Tdap vaccine: n/d Shingles vaccine: n/d   Covid-19:05/17/19, 06/07/19, 02/26/20  Advanced directives: no  Conditions/risks identified: none  Next appointment: Follow up in one year for your annual wellness visit - declined   Preventive Care 65 Years and Older, Female Preventive care refers to lifestyle choices and visits with your health care provider that can promote health and wellness. What does preventive care include? A yearly physical exam. This is also called an annual well check. Dental exams once or twice a year. Routine eye exams. Ask your health care provider how often you should have your eyes checked. Personal lifestyle choices, including: Daily care of your teeth and gums. Regular physical activity. Eating a healthy diet. Avoiding tobacco and drug use. Limiting alcohol use. Practicing safe sex. Taking low-dose aspirin every day. Taking vitamin and mineral supplements as recommended by your health care provider. What happens during an annual well check? The services and screenings done by your health care provider during your annual well check will depend on your age, overall health, lifestyle risk factors, and family history of disease. Counseling  Your health care provider may ask you questions about your: Alcohol use. Tobacco use. Drug use. Emotional well-being. Home and relationship well-being. Sexual  activity. Eating habits. History of falls. Memory and ability to understand (cognition). Work and work Astronomer. Reproductive health. Screening  You may have the following tests or measurements: Height, weight, and BMI. Blood pressure. Lipid and cholesterol levels. These may be checked every 5 years, or more frequently if you are over 64 years old. Skin check. Lung cancer screening. You may have this screening every year starting at age 22 if you have a 30-pack-year history of smoking and currently smoke or have quit within the past 15 years. Fecal occult blood test (FOBT) of the stool. You may have this test every year starting at age 52. Flexible sigmoidoscopy or colonoscopy. You may have a sigmoidoscopy every 5 years or a colonoscopy every 10 years starting at age 3. Hepatitis C blood test. Hepatitis B blood test. Sexually transmitted disease (STD) testing. Diabetes screening. This is done by checking your blood sugar (glucose) after you have not eaten for a while (fasting). You may have this done every 1-3 years. Bone density scan. This is done to screen for osteoporosis. You may have this done starting at age 40. Mammogram. This may be done every 1-2 years. Talk to your health care provider about how often you should have regular mammograms. Talk with your health care provider about your test results, treatment options, and if necessary, the need for more tests. Vaccines  Your health care provider may recommend certain vaccines, such as: Influenza vaccine. This is recommended every year. Tetanus, diphtheria, and acellular pertussis (Tdap, Td) vaccine. You may need a Td booster every 10 years. Zoster vaccine. You may need this after age 39. Pneumococcal 13-valent conjugate (PCV13) vaccine. One dose is recommended after age 43. Pneumococcal polysaccharide (PPSV23) vaccine. One dose is recommended after age 51. Talk to your health care provider about  which screenings and vaccines  you need and how often you need them. This information is not intended to replace advice given to you by your health care provider. Make sure you discuss any questions you have with your health care provider. Document Released: 05/05/2015 Document Revised: 12/27/2015 Document Reviewed: 02/07/2015 Elsevier Interactive Patient Education  2017 [place] Prevention in the Home Falls can cause injuries. They can happen to people of all ages. There are many things you can do to make your home safe and to help prevent falls. What can I do on the outside of my home? Regularly fix the edges of walkways and driveways and fix any cracks. Remove anything that might make you trip as you walk through a door, such as a raised step or threshold. Trim any bushes or trees on the path to your home. Use bright outdoor lighting. Clear any walking paths of anything that might make someone trip, such as rocks or tools. Regularly check to see if handrails are loose or broken. Make sure that both sides of any steps have handrails. Any raised decks and porches should have guardrails on the edges. Have any leaves, snow, or ice cleared regularly. Use sand or salt on walking paths during winter. Clean up any spills in your garage right away. This includes oil or grease spills. What can I do in the bathroom? Use night lights. Install grab bars by the toilet and in the tub and shower. Do not use towel bars as grab bars. Use non-skid mats or decals in the tub or shower. If you need to sit down in the shower, use a plastic, non-slip stool. Keep the floor dry. Clean up any water that spills on the floor as soon as it happens. Remove soap buildup in the tub or shower regularly. Attach bath mats securely with double-sided non-slip rug tape. Do not have throw rugs and other things on the floor that can make you trip. What can I do in the bedroom? Use night lights. Make sure that you have a light by your bed that  is easy to reach. Do not use any sheets or blankets that are too big for your bed. They should not hang down onto the floor. Have a firm chair that has side arms. You can use this for support while you get dressed. Do not have throw rugs and other things on the floor that can make you trip. What can I do in the kitchen? Clean up any spills right away. Avoid walking on wet floors. Keep items that you use a lot in easy-to-reach places. If you need to reach something above you, use a strong step stool that has a grab bar. Keep electrical cords out of the way. Do not use floor polish or wax that makes floors slippery. If you must use wax, use non-skid floor wax. Do not have throw rugs and other things on the floor that can make you trip. What can I do with my stairs? Do not leave any items on the stairs. Make sure that there are handrails on both sides of the stairs and use them. Fix handrails that are broken or loose. Make sure that handrails are as long as the stairways. Check any carpeting to make sure that it is firmly attached to the stairs. Fix any carpet that is loose or worn. Avoid having throw rugs at the top or bottom of the stairs. If you do have throw rugs, attach them to the floor with  carpet tape. Make sure that you have a light switch at the top of the stairs and the bottom of the stairs. If you do not have them, ask someone to add them for you. What else can I do to help prevent falls? Wear shoes that: Do not have high heels. Have rubber bottoms. Are comfortable and fit you well. Are closed at the toe. Do not wear sandals. If you use a stepladder: Make sure that it is fully opened. Do not climb a closed stepladder. Make sure that both sides of the stepladder are locked into place. Ask someone to hold it for you, if possible. Clearly mark and make sure that you can see: Any grab bars or handrails. First and last steps. Where the edge of each step is. Use tools that help you  move around (mobility aids) if they are needed. These include: Canes. Walkers. Scooters. Crutches. Turn on the lights when you go into a dark area. Replace any light bulbs as soon as they burn out. Set up your furniture so you have a clear path. Avoid moving your furniture around. If any of your floors are uneven, fix them. If there are any pets around you, be aware of where they are. Review your medicines with your doctor. Some medicines can make you feel dizzy. This can increase your chance of falling. Ask your doctor what other things that you can do to help prevent falls. This information is not intended to replace advice given to you by your health care provider. Make sure you discuss any questions you have with your health care provider. Document Released: 02/02/2009 Document Revised: 09/14/2015 Document Reviewed: 05/13/2014 Elsevier Interactive Patient Education  2017 [employer].

## 2021-09-10 ENCOUNTER — Other Ambulatory Visit: Payer: Self-pay | Admitting: Family Medicine

## 2021-10-13 DIAGNOSIS — R200 Anesthesia of skin: Secondary | ICD-10-CM | POA: Diagnosis not present

## 2021-10-13 DIAGNOSIS — I493 Ventricular premature depolarization: Secondary | ICD-10-CM | POA: Diagnosis not present

## 2021-10-13 DIAGNOSIS — R202 Paresthesia of skin: Secondary | ICD-10-CM | POA: Diagnosis not present

## 2021-10-13 DIAGNOSIS — M47812 Spondylosis without myelopathy or radiculopathy, cervical region: Secondary | ICD-10-CM | POA: Diagnosis not present

## 2021-10-13 DIAGNOSIS — E785 Hyperlipidemia, unspecified: Secondary | ICD-10-CM | POA: Diagnosis not present

## 2021-10-13 DIAGNOSIS — F17210 Nicotine dependence, cigarettes, uncomplicated: Secondary | ICD-10-CM | POA: Diagnosis not present

## 2021-10-13 DIAGNOSIS — R0789 Other chest pain: Secondary | ICD-10-CM | POA: Diagnosis not present

## 2021-10-13 DIAGNOSIS — I491 Atrial premature depolarization: Secondary | ICD-10-CM | POA: Diagnosis not present

## 2021-10-13 DIAGNOSIS — Z79899 Other long term (current) drug therapy: Secondary | ICD-10-CM | POA: Diagnosis not present

## 2021-10-13 DIAGNOSIS — M2578 Osteophyte, vertebrae: Secondary | ICD-10-CM | POA: Diagnosis not present

## 2021-10-13 DIAGNOSIS — Z888 Allergy status to other drugs, medicaments and biological substances status: Secondary | ICD-10-CM | POA: Diagnosis not present

## 2021-10-13 DIAGNOSIS — M25512 Pain in left shoulder: Secondary | ICD-10-CM | POA: Diagnosis not present

## 2021-10-13 DIAGNOSIS — R079 Chest pain, unspecified: Secondary | ICD-10-CM | POA: Diagnosis not present

## 2021-10-13 DIAGNOSIS — Z7901 Long term (current) use of anticoagulants: Secondary | ICD-10-CM | POA: Diagnosis not present

## 2021-10-13 DIAGNOSIS — M5412 Radiculopathy, cervical region: Secondary | ICD-10-CM | POA: Diagnosis not present

## 2021-10-29 ENCOUNTER — Other Ambulatory Visit: Payer: Self-pay | Admitting: Family Medicine

## 2021-11-08 ENCOUNTER — Ambulatory Visit: Payer: Medicare Other | Admitting: Family Medicine

## 2021-11-15 ENCOUNTER — Ambulatory Visit (INDEPENDENT_AMBULATORY_CARE_PROVIDER_SITE_OTHER): Payer: Medicare Other | Admitting: Family Medicine

## 2021-11-15 ENCOUNTER — Encounter: Payer: Self-pay | Admitting: Family Medicine

## 2021-11-15 VITALS — BP 114/59 | HR 59 | Temp 97.8°F | Resp 16 | Ht 62.0 in | Wt 135.0 lb

## 2021-11-15 DIAGNOSIS — I480 Paroxysmal atrial fibrillation: Secondary | ICD-10-CM | POA: Diagnosis not present

## 2021-11-15 DIAGNOSIS — E782 Mixed hyperlipidemia: Secondary | ICD-10-CM | POA: Diagnosis not present

## 2021-11-15 DIAGNOSIS — D6869 Other thrombophilia: Secondary | ICD-10-CM | POA: Diagnosis not present

## 2021-11-15 DIAGNOSIS — E875 Hyperkalemia: Secondary | ICD-10-CM | POA: Insufficient documentation

## 2021-11-15 DIAGNOSIS — J209 Acute bronchitis, unspecified: Secondary | ICD-10-CM | POA: Insufficient documentation

## 2021-11-15 DIAGNOSIS — J440 Chronic obstructive pulmonary disease with acute lower respiratory infection: Secondary | ICD-10-CM

## 2021-11-15 DIAGNOSIS — E039 Hypothyroidism, unspecified: Secondary | ICD-10-CM

## 2021-11-15 DIAGNOSIS — N3946 Mixed incontinence: Secondary | ICD-10-CM

## 2021-11-15 MED ORDER — OXYBUTYNIN CHLORIDE ER 5 MG PO TB24: 5.0000 mg | ORAL_TABLET | Freq: Every day | ORAL | 3 refills | Status: DC

## 2021-11-15 MED ORDER — METOPROLOL TARTRATE 25 MG PO TABS: 12.5000 mg | ORAL_TABLET | ORAL | 1 refills | Status: DC | PRN

## 2021-11-15 MED ORDER — MONTELUKAST SODIUM 10 MG PO TABS: 10.0000 mg | ORAL_TABLET | Freq: Every day | ORAL | 3 refills | Status: DC

## 2021-11-15 NOTE — Assessment & Plan Note (Signed)
D/t use of Eliquis- 5 mg BID s/s Afib

## 2021-11-15 NOTE — Assessment & Plan Note (Signed)
Chronic, stable In SR today Remains on Toprol 25 Remains on Eliquis 5 mg BID Will write for smaller quantity of IR metop 25- pt to take 12.5 mg if in Afib/RVR, elevated heart rate

## 2021-11-15 NOTE — Progress Notes (Signed)
Established patient visit  I,Joseline E Rosas,acting as a scribe for Gwyneth Sprout, FNP.,have documented all relevant documentation on the behalf of Gwyneth Sprout, FNP,as directed by  Gwyneth Sprout, FNP while in the presence of Gwyneth Sprout, FNP.   Patient: Emma Villarreal   DOB: 07/28/1940   81 y.o. Female  MRN: [id number] Visit Date: 11/15/2021  Today's healthcare provider: Gwyneth Sprout, FNP  Introduced to nurse practitioner role and practice setting.  All questions answered.  Discussed provider/patient relationship and expectations.  Chief Complaint  Patient presents with   Follow-up chronic disease   Subjective    HPI Reports that she is trying to quit smoking.  Hypothyroid, follow-up  Lab Results  Component Value Date   TSH 3.830 05/18/2021   TSH 1.740 11/07/2020   TSH 3.210 05/19/2020    Wt Readings from Last 3 Encounters:  11/15/21 135 lb (61.2 kg)  06/25/21 134 lb (60.8 kg)  05/10/21 133 lb (60.3 kg)    She was last seen for hypothyroid 6 months ago.  Management since that visit includes none. She reports excellent compliance with treatment. She is not having side effects.   Symptoms: No change in energy level No constipation  No diarrhea No heat / cold intolerance  No nervousness No palpitations  Yes weight changes    -----------------------------------------------------------------------------------------  Lipid/Cholesterol, Follow-up  Last lipid panel Other pertinent labs  Lab Results  Component Value Date   CHOL 129 05/18/2021   HDL 63 05/18/2021   LDLCALC 44 05/18/2021   TRIG 129 05/18/2021   CHOLHDL 2.0 05/18/2021   Lab Results  Component Value Date   ALT 25 05/18/2021   AST 24 05/18/2021   PLT 224 05/18/2021   TSH 3.830 05/18/2021     She was last seen for this 6 months ago.  Management since that visit includes continue statin.  She reports excellent compliance with treatment. She is not having side effects.   Symptoms: No  chest pain No chest pressure/discomfort  No dyspnea Yes-lower extremity edema  No numbness or tingling of extremity No orthopnea  No palpitations No paroxysmal nocturnal dyspnea  No speech difficulty No syncope   Current diet: well balanced Current exercise: walking and some light weights.  The ASCVD Risk score (Arnett DK, et al., 2019) failed to calculate for the following reasons:   The 2019 ASCVD risk score is only valid for ages 26 to 60  ---------------------------------------------------------------------------------------------------  Medications: Outpatient Medications Prior to Visit  Medication Sig   atorvastatin (LIPITOR) 10 MG tablet Take 1 tablet (10 mg total) by mouth daily. Take one tablet PO at bedtime   BINAXNOW COVID-19 AG HOME TEST KIT Use as Directed on the Package   Cholecalciferol 50 MCG (2000 UT) TABS Take by mouth daily.   Cyanocobalamin (B-12 PO) Take by mouth daily in the afternoon.   ELDERBERRY PO Take by mouth daily at 8 pm.   ELIQUIS 5 MG TABS tablet Take 1 tablet by mouth twice daily   levothyroxine (SYNTHROID) 50 MCG tablet TAKE 1 TABLET BY MOUTH BEFORE BREAKFAST   metoprolol succinate (TOPROL XL) 25 MG 24 hr tablet Take 1 tablet (25 mg total) by mouth daily.   [DISCONTINUED] metoprolol tartrate (LOPRESSOR) 25 MG tablet Take 0.5 tablets (12.5 mg total) by mouth as needed. Take for tachycardiac   [DISCONTINUED] oxybutynin (DITROPAN-XL) 5 MG 24 hr tablet Take 1 tablet (5 mg total) by mouth at bedtime.   No facility-administered medications  prior to visit.    Review of Systems  Last CBC Lab Results  Component Value Date   WBC 6.9 05/18/2021   HGB 13.9 05/18/2021   HCT 43.3 05/18/2021   MCV 99 (H) 05/18/2021   MCH 31.9 05/18/2021   RDW 12.3 05/18/2021   PLT 224 73/22/0254   Last metabolic panel Lab Results  Component Value Date   GLUCOSE 100 (H) 05/18/2021   NA 140 05/18/2021   K 4.8 05/18/2021   CL 101 05/18/2021   CO2 28 05/18/2021   BUN  8 05/18/2021   CREATININE 0.88 05/18/2021   EGFR 66 05/18/2021   CALCIUM 9.8 05/18/2021   PROT 7.2 05/18/2021   ALBUMIN 4.7 05/18/2021   LABGLOB 2.5 05/18/2021   AGRATIO 1.9 05/18/2021   BILITOT 0.5 05/18/2021   ALKPHOS 67 05/18/2021   AST 24 05/18/2021   ALT 25 05/18/2021   Last lipids Lab Results  Component Value Date   CHOL 129 05/18/2021   HDL 63 05/18/2021   LDLCALC 44 05/18/2021   TRIG 129 05/18/2021   CHOLHDL 2.0 05/18/2021   Last hemoglobin A1c Lab Results  Component Value Date   HGBA1C 5.6 05/19/2020   Last thyroid functions Lab Results  Component Value Date   TSH 3.830 05/18/2021     Objective    BP (!) 114/59 (BP Location: Right Arm, Patient Position: Sitting, Cuff Size: Normal)   Pulse (!) 59   Temp 97.8 F (36.6 C) (Oral)   Resp 16   Ht $R'5\' 2"'IE$  (1.575 m)   Wt 135 lb (61.2 kg)   BMI 24.69 kg/m   BP Readings from Last 3 Encounters:  11/15/21 (!) 114/59  05/10/21 (!) 119/57  12/05/20 124/60   Wt Readings from Last 3 Encounters:  11/15/21 135 lb (61.2 kg)  06/25/21 134 lb (60.8 kg)  05/10/21 133 lb (60.3 kg)   SpO2 Readings from Last 3 Encounters:  05/10/21 97%  12/05/20 95%  11/07/20 95%   Physical Exam Vitals and nursing note reviewed.  Constitutional:      General: She is not in acute distress.    Appearance: Normal appearance. She is normal weight. She is not ill-appearing, toxic-appearing or diaphoretic.  HENT:     Head: Normocephalic and atraumatic.  Cardiovascular:     Rate and Rhythm: Normal rate and regular rhythm.     Pulses: Normal pulses.     Heart sounds: Normal heart sounds. No murmur heard.    No friction rub. No gallop.  Pulmonary:     Effort: Pulmonary effort is normal. No respiratory distress.     Breath sounds: No stridor. Wheezing present. No rhonchi or rales.  Chest:     Chest wall: No tenderness.  Abdominal:     General: Bowel sounds are normal.     Palpations: Abdomen is soft.  Musculoskeletal:         General: No swelling, tenderness, deformity or signs of injury. Normal range of motion.     Right lower leg: No edema.     Left lower leg: No edema.  Skin:    General: Skin is warm and dry.     Capillary Refill: Capillary refill takes less than 2 seconds.     Coloration: Skin is not jaundiced or pale.     Findings: No bruising, erythema, lesion or rash.  Neurological:     General: No focal deficit present.     Mental Status: She is alert and oriented to person, place, and time.  Mental status is at baseline.     Cranial Nerves: No cranial nerve deficit.     Sensory: No sensory deficit.     Motor: No weakness.     Coordination: Coordination normal.  Psychiatric:        Mood and Affect: Mood normal.        Behavior: Behavior normal.        Thought Content: Thought content normal.        Judgment: Judgment normal.    No results found for any visits on 11/15/21.  Assessment & Plan     Problem List Items Addressed This Visit       Cardiovascular and Mediastinum   Paroxysmal atrial fibrillation (HCC)    Chronic, stable In SR today Remains on Toprol 25 Remains on Eliquis 5 mg BID Will write for smaller quantity of IR metop 25- pt to take 12.5 mg if in Afib/RVR, elevated heart rate      Relevant Medications   metoprolol tartrate (LOPRESSOR) 25 MG tablet   Other Relevant Orders   CBC with Differential/Platelet   INR/PT     Respiratory   Acute bronchitis with COPD ([place]) - Primary    Seen in ED with atypical CP Negative cardiac origin Encouraged to stop smoking Was smoking up to 20 cig/7 days prior Has quit for 2.5 weeks now- congratulated Add sinulair to assist Continues to use mucinex Denies need for inhalers, controller or as needed; despite active wheezing      Relevant Medications   montelukast (SINGULAIR) 10 MG tablet     Endocrine   Hypothyroidism    Chronic previously stable On 50 mcg synthroid Repeat labs      Relevant Medications   metoprolol tartrate  (LOPRESSOR) 25 MG tablet   Other Relevant Orders   TSH + free T4     Hematopoietic and Hemostatic   Acquired thrombophilia (HCC)    D/t use of Eliquis- 5 mg BID s/s Afib      Relevant Orders   CBC with Differential/Platelet   INR/PT     Other   Mixed hyperlipidemia    Chronic, stable recommend diet low in saturated fat and regular exercise - 30 min at least 5 times per week Repeat LP Elevated stroke risk d/t Afib, tobacco use/abuse/age Unable to calculate ASCVD d/t age On 10 mg of lipitor total of 129, ldl of 44 6 months ago      Relevant Medications   metoprolol tartrate (LOPRESSOR) 25 MG tablet   Other Relevant Orders   Comprehensive Metabolic Panel (CMET)   Lipid panel   INR/PT   Mixed stress and urge urinary incontinence    Chronic stable Refills provided of ditropan 5 mg to assist Refer to urology if needed      Relevant Medications   oxybutynin (DITROPAN-XL) 5 MG 24 hr tablet   Serum potassium elevated    Previous; no acute complaints related to hyperkalemia Repeat CMP      Relevant Orders   Comprehensive Metabolic Panel (CMET)     Return in about 3 months (around 02/15/2022) for annual examination.      Vonna Kotyk, FNP, have reviewed all documentation for this visit. The documentation on 11/15/21 for the exam, diagnosis, procedures, and orders are all accurate and complete.    Gwyneth Sprout, [place] [phone] (phone) [phone] (fax)  [place]

## 2021-11-15 NOTE — Assessment & Plan Note (Signed)
Seen in ED with atypical CP Negative cardiac origin Encouraged to stop smoking Was smoking up to 20 cig/7 days prior Has quit for 2.5 weeks now- congratulated Add sinulair to assist Continues to use mucinex Denies need for inhalers, controller or as needed; despite active wheezing

## 2021-11-15 NOTE — Patient Instructions (Signed)
The CDC recommends two doses of Shingrix (the new shingles vaccine) separated by 2 to 6 months for adults age 81 years and older. I recommend checking with your insurance plan regarding coverage for this vaccine.    

## 2021-11-15 NOTE — Assessment & Plan Note (Signed)
Chronic, stable recommend diet low in saturated fat and regular exercise - 30 min at least 5 times per week Repeat LP Elevated stroke risk d/t Afib, tobacco use/abuse/age Unable to calculate ASCVD d/t age On 10 mg of lipitor total of 129, ldl of 44 6 months ago

## 2021-11-15 NOTE — Assessment & Plan Note (Signed)
Previous; no acute complaints related to hyperkalemia Repeat CMP

## 2021-11-15 NOTE — Assessment & Plan Note (Signed)
Chronic previously stable On 50 mcg synthroid Repeat labs

## 2021-11-15 NOTE — Assessment & Plan Note (Signed)
Chronic stable Refills provided of ditropan 5 mg to assist Refer to urology if needed

## 2021-11-16 NOTE — Progress Notes (Signed)
Blood chemistry shows borderline elevation in calcium; otherwise, normal/stable.  Cholesterol stable; excess fat seen on sample. I recommend diet low in saturated fat and regular exercise - 30 min at least 5 times per week  Normal thyroid.  Please let us know if you have any questions.  Thank you, Jacky Kindle, FNP  [facility] [address] [place], [place] [address] [phone] (phone) [phone] (fax) [facility]

## 2021-11-18 LAB — CBC WITH DIFFERENTIAL/PLATELET
Basophils Absolute: 0.1 10*3/uL (ref 0.0–0.2)
Basos: 1 %
EOS (ABSOLUTE): 0.3 10*3/uL (ref 0.0–0.4)
Eos: 5 %
Hematocrit: 40.7 % (ref 34.0–46.6)
Hemoglobin: 13.4 g/dL (ref 11.1–15.9)
Immature Grans (Abs): 0 10*3/uL (ref 0.0–0.1)
Immature Granulocytes: 0 %
Lymphocytes Absolute: 2.3 10*3/uL (ref 0.7–3.1)
Lymphs: 36 %
MCH: 31.3 pg (ref 26.6–33.0)
MCHC: 32.9 g/dL (ref 31.5–35.7)
MCV: 95 fL (ref 79–97)
Monocytes Absolute: 0.7 10*3/uL (ref 0.1–0.9)
Monocytes: 10 %
Neutrophils Absolute: 3.1 10*3/uL (ref 1.4–7.0)
Neutrophils: 48 %
Platelets: 276 10*3/uL (ref 150–450)
RBC: 4.28 x10E6/uL (ref 3.77–5.28)
RDW: 12.1 % (ref 11.7–15.4)
WBC: 6.5 10*3/uL (ref 3.4–10.8)

## 2021-11-18 LAB — COMPREHENSIVE METABOLIC PANEL
ALT: 31 IU/L (ref 0–32)
AST: 32 IU/L (ref 0–40)
Albumin/Globulin Ratio: 1.6 (ref 1.2–2.2)
Albumin: 4.5 g/dL (ref 3.8–4.8)
Alkaline Phosphatase: 70 IU/L (ref 44–121)
BUN/Creatinine Ratio: 9 — ABNORMAL LOW (ref 12–28)
BUN: 7 mg/dL — ABNORMAL LOW (ref 8–27)
Bilirubin Total: 0.4 mg/dL (ref 0.0–1.2)
CO2: 29 mmol/L (ref 20–29)
Calcium: 10.4 mg/dL — ABNORMAL HIGH (ref 8.7–10.3)
Chloride: 98 mmol/L (ref 96–106)
Creatinine, Ser: 0.75 mg/dL (ref 0.57–1.00)
Globulin, Total: 2.9 g/dL (ref 1.5–4.5)
Glucose: 87 mg/dL (ref 70–99)
Potassium: 4.4 mmol/L (ref 3.5–5.2)
Sodium: 138 mmol/L (ref 134–144)
Total Protein: 7.4 g/dL (ref 6.0–8.5)
eGFR: 80 mL/min/{1.73_m2} (ref >59)

## 2021-11-18 LAB — TSH+FREE T4
Free T4: 1.22 ng/dL (ref 0.82–1.77)
TSH: 1.8 u[IU]/mL (ref 0.450–4.500)

## 2021-11-18 LAB — LIPID PANEL
Chol/HDL Ratio: 2.5 ratio (ref 0.0–4.4)
Cholesterol, Total: 143 mg/dL (ref 100–199)
HDL: 57 mg/dL (ref >39)
LDL Chol Calc (NIH): 56 mg/dL (ref 0–99)
Triglycerides: 183 mg/dL — ABNORMAL HIGH (ref 0–149)
VLDL Cholesterol Cal: 30 mg/dL (ref 5–40)

## 2021-11-18 LAB — PROTIME-INR
INR: 1 (ref 0.9–1.2)
Prothrombin Time: 10.9 s (ref 9.1–12.0)

## 2021-12-01 ENCOUNTER — Other Ambulatory Visit: Payer: Self-pay | Admitting: Family Medicine

## 2021-12-15 NOTE — Progress Notes (Unsigned)
Cardiology Office Note  Date:  12/18/2021   ID:  Emma Villarreal, DOB 08-Jan-1941, MRN [id number]  PCP:  Virginia Crews, MD   Chief Complaint  Patient presents with   12 month follow up     "Doing well." Medications reviewed by the patient verbally.     HPI:  [name] is a 81 year old woman with past medical history of Atrial fibrillation, cardioversion 2017 Hyperlipidemia Smoker 3pp week Presenting  for follow-up of her atrial fibrillation  LOV 8/22 Chest pain 6/23, seen in the ER [facility] Could not raise arm, could not pick up coffee cup pain radiating from the left side of the neck to the left shoulder and down the left arm. Pain is constant Cervical radiculopathy Thinks it was anxiety, has not has any more epsiodes  Cutting down on smoking, just a puff here and there Gained 5 pounds Dental problems, bottom teeth pulled out  Lives with daughter, does not drive No exercise program  Denies any tachypalpitations concerning for atrial fibrillation  EKG personally reviewed by myself on todays visit nsr rate 65, PACs no significant ST or T wave changes  Past medical history reviewed Seen in the hospital in 2017 for atrial fibrillation,  underwent cardioversion, started on Eliquis metoprolol  Echocardiogram 2019, told she had a mild leak of the valve Stress test September 2019   PMH:   has a past medical history of A-fib ([place]), Allergy, Arthritis, Cataract, COPD (chronic obstructive pulmonary disease) ([place]), Hyperlipidemia, Hypothyroidism, and Osteopenia.  PSH:    Past Surgical History:  Procedure Laterality Date   CHOLECYSTECTOMY     VAGINAL HYSTERECTOMY      Current Outpatient Medications  Medication Sig Dispense Refill   atorvastatin (LIPITOR) 10 MG tablet Take 1 tablet (10 mg total) by mouth daily. Take one tablet PO at bedtime 90 tablet 3   Cholecalciferol 50 MCG (2000 UT) TABS Take by mouth daily.     Cyanocobalamin (B-12 PO) Take by mouth daily in the  afternoon.     ELDERBERRY PO Take by mouth daily at 8 pm.     ELIQUIS 5 MG TABS tablet Take 1 tablet by mouth twice daily 180 tablet 0   levothyroxine (SYNTHROID) 50 MCG tablet TAKE 1 TABLET BY MOUTH BEFORE BREAKFAST 90 tablet 0   metoprolol succinate (TOPROL-XL) 25 MG 24 hr tablet Take 1 tablet by mouth once daily 90 tablet 0   metoprolol tartrate (LOPRESSOR) 25 MG tablet Take 0.5 tablets (12.5 mg total) by mouth as needed. Take for tachycardiac 30 tablet 1   montelukast (SINGULAIR) 10 MG tablet Take 1 tablet (10 mg total) by mouth at bedtime. 90 tablet 3   oxybutynin (DITROPAN-XL) 5 MG 24 hr tablet Take 1 tablet (5 mg total) by mouth at bedtime. 90 tablet 3   BINAXNOW COVID-19 AG HOME TEST KIT Use as Directed on the Package (Patient not taking: Reported on 12/18/2021)     No current facility-administered medications for this visit.    Allergies:   Molds & smuts and Varenicline   Social History:  The patient  reports that she has been smoking cigarettes. She has a 60.00 pack-year smoking history. She has never used smokeless tobacco. She reports that she does not currently use alcohol. She reports that she does not use drugs.   Family History:   family history includes Colon cancer in her maternal aunt; Dementia in her mother; Diabetes in her daughter and daughter; Stroke in her maternal grandmother and mother.  Review of Systems: Review of Systems  Constitutional: Negative.   HENT: Negative.    Respiratory: Negative.    Cardiovascular:  Positive for palpitations.  Gastrointestinal: Negative.   Musculoskeletal: Negative.   Neurological: Negative.   Psychiatric/Behavioral: Negative.    All other systems reviewed and are negative.  PHYSICAL EXAM: VS:  BP (!) 110/50 (BP Location: Left Arm, Patient Position: Sitting, Cuff Size: Normal)   Pulse 65   Ht _0  (1.575 m)   Wt 141 lb (64 kg)   BMI 25.79 kg/m  , BMI Body mass index is 25.79 kg/m. Constitutional:  oriented to person,  place, and time. No distress.  HENT:  Head: Grossly normal Eyes:  no discharge. No scleral icterus.  Neck: No JVD, no carotid bruits  Cardiovascular: Regular rate and rhythm, no murmurs appreciated Pulmonary/Chest: Clear to auscultation bilaterally, no wheezes or rails Abdominal: Soft.  no distension.  no tenderness.  Musculoskeletal: Normal range of motion Neurological:  normal muscle tone. Coordination normal. No atrophy Skin: Skin warm and dry Psychiatric: normal affect, pleasant  Recent Labs: 11/15/2021: ALT 31; BUN 7; Creatinine, Ser 0.75; Hemoglobin 13.4; Platelets 276; Potassium 4.4; Sodium 138; TSH 1.800    Lipid Panel Lab Results  Component Value Date   CHOL 143 11/15/2021   HDL 57 11/15/2021   LDLCALC 56 11/15/2021   TRIG 183 (H) 11/15/2021    Wt Readings from Last 3 Encounters:  12/18/21 141 lb (64 kg)  11/15/21 135 lb (61.2 kg)  06/25/21 134 lb (60.8 kg)     ASSESSMENT AND PLAN:  Problem List Items Addressed This Visit       Cardiology Problems   Paroxysmal atrial fibrillation ([place]) - Primary   Mixed hyperlipidemia   Other Visit Diagnoses     Smoker       Chest tightness       Chronic anticoagulation          Paroxysmal atrial fibrillation On Eliquis, Maintaining normal sinus rhythm Continue metoprolol succinate 25 daily Metoprolol tartrate PRN  Smoker We have encouraged her to continue to work on weaning her cigarettes and smoking cessation. She will continue to work on this and does not want any assistance with chantix.    Hyperlipidemia Cholesterol is at goal on the current lipid regimen. No changes to the medications were made.  Cardiac risk factors Cutting back on her smoking, hyperlipidemia No angina   Total encounter time more than 30 minutes Greater than 50% was spent in counseling and coordination of care with the patient   Signed, Esmond Plants, M.D., Ph.D. [place], [place]

## 2021-12-18 ENCOUNTER — Other Ambulatory Visit: Payer: Self-pay | Admitting: Cardiovascular Disease

## 2021-12-18 ENCOUNTER — Ambulatory Visit: Payer: Medicare Other | Attending: Cardiovascular Disease | Admitting: Cardiovascular Disease

## 2021-12-18 ENCOUNTER — Encounter: Payer: Self-pay | Admitting: Cardiovascular Disease

## 2021-12-18 VITALS — BP 110/50 | HR 65 | Ht 62.0 in | Wt 141.0 lb

## 2021-12-18 DIAGNOSIS — E782 Mixed hyperlipidemia: Secondary | ICD-10-CM

## 2021-12-18 DIAGNOSIS — F17200 Nicotine dependence, unspecified, uncomplicated: Secondary | ICD-10-CM

## 2021-12-18 DIAGNOSIS — I480 Paroxysmal atrial fibrillation: Secondary | ICD-10-CM | POA: Diagnosis not present

## 2021-12-18 DIAGNOSIS — R0789 Other chest pain: Secondary | ICD-10-CM

## 2021-12-18 DIAGNOSIS — Z7901 Long term (current) use of anticoagulants: Secondary | ICD-10-CM

## 2021-12-18 MED ORDER — METOPROLOL TARTRATE 25 MG PO TABS: 12.5000 mg | ORAL_TABLET | ORAL | 1 refills | Status: DC | PRN

## 2021-12-18 MED ORDER — METOPROLOL SUCCINATE ER 25 MG PO TB24: 25.0000 mg | ORAL_TABLET | Freq: Every day | ORAL | 3 refills | Status: DC

## 2021-12-18 MED ORDER — APIXABAN 5 MG PO TABS: 5.0000 mg | ORAL_TABLET | Freq: Two times a day (BID) | ORAL | 3 refills | Status: DC

## 2021-12-18 MED ORDER — ATORVASTATIN CALCIUM 10 MG PO TABS: 10.0000 mg | ORAL_TABLET | Freq: Every day | ORAL | 3 refills | Status: DC

## 2021-12-18 NOTE — Patient Instructions (Signed)
Medication Instructions:  No changes  If you need a refill on your cardiac medications before your next appointment, please call your pharmacy.   Lab work: No new labs needed  Testing/Procedures: No new testing needed  Follow-Up: At [facility], you and your health needs are our priority.  As part of our continuing mission to provide you with exceptional heart care, we have created designated Provider Care Teams.  These Care Teams include your primary Cardiologist (physician) and Advanced Practice Providers (APPs -  Physician Assistants and Nurse Practitioners) who all work together to provide you with the care you need, when you need it.  You will need a follow up appointment in 12 months  Providers on your designated Care Team:   Christopher Berge, NP Ryan Dunn, PA-C Cadence Furth, PA-C  COVID-19 Vaccine Information can be found at: [email or web address] For questions related to vaccine distribution or appointments, please email [email or web address] or call [phone].   

## 2022-02-07 ENCOUNTER — Other Ambulatory Visit: Payer: Self-pay | Admitting: Family Medicine

## 2022-02-07 ENCOUNTER — Encounter: Payer: Medicare Other | Admitting: Family Medicine

## 2022-02-07 NOTE — Telephone Encounter (Signed)
Pt called reporting that she requested this days ago and that she has two pills left. Please advise

## 2022-02-07 NOTE — Telephone Encounter (Signed)
Requested Prescriptions  Pending Prescriptions Disp Refills  . levothyroxine (SYNTHROID) 50 MCG tablet [Pharmacy Med Name: Levothyroxine Sodium 50 MCG Oral Tablet] 90 tablet 0    Sig: TAKE 1 TABLET BY MOUTH BEFORE BREAKFAST     Endocrinology:  Hypothyroid Agents Passed - 02/07/2022  9:37 AM      Passed - TSH in normal range and within 360 days    TSH  Date Value Ref Range Status  11/15/2021 1.800 0.450 - 4.500 uIU/mL Final         Passed - Valid encounter within last 12 months    Recent Outpatient Visits          2 months ago Acute bronchitis with COPD [facility]   [facility] Tally Joe T, FNP   9 months ago Hypothyroidism, unspecified type   [facility], Dionne Bucy, MD   1 year ago Encounter for annual physical exam   [facility], Dionne Bucy, MD   1 year ago Paroxysmal atrial fibrillation [facility])   [facility], Dionne Bucy, MD      Future Appointments            In 3 weeks Gwyneth Sprout, [place], [facility]

## 2022-02-28 ENCOUNTER — Ambulatory Visit (INDEPENDENT_AMBULATORY_CARE_PROVIDER_SITE_OTHER): Payer: Medicare Other | Admitting: Family Medicine

## 2022-02-28 ENCOUNTER — Encounter: Payer: Self-pay | Admitting: Family Medicine

## 2022-02-28 VITALS — BP 117/57 | HR 65 | Resp 16 | Ht 62.0 in | Wt 138.0 lb

## 2022-02-28 DIAGNOSIS — Z23 Encounter for immunization: Secondary | ICD-10-CM | POA: Insufficient documentation

## 2022-02-28 DIAGNOSIS — L659 Nonscarring hair loss, unspecified: Secondary | ICD-10-CM | POA: Diagnosis not present

## 2022-02-28 DIAGNOSIS — E034 Atrophy of thyroid (acquired): Secondary | ICD-10-CM | POA: Diagnosis not present

## 2022-02-28 DIAGNOSIS — Z0000 Encounter for general adult medical examination without abnormal findings: Secondary | ICD-10-CM | POA: Insufficient documentation

## 2022-02-28 DIAGNOSIS — D6869 Other thrombophilia: Secondary | ICD-10-CM | POA: Diagnosis not present

## 2022-02-28 DIAGNOSIS — E559 Vitamin D deficiency, unspecified: Secondary | ICD-10-CM

## 2022-02-28 DIAGNOSIS — E782 Mixed hyperlipidemia: Secondary | ICD-10-CM

## 2022-02-28 HISTORY — DX: Encounter for general adult medical examination without abnormal findings: Z00.00

## 2022-02-28 NOTE — Progress Notes (Signed)
Complete physical exam  Patient: Emma Villarreal   DOB: 1941-01-05   81 y.o. Female  MRN: [id number] Visit Date: 02/28/2022  Today's healthcare provider: Gwyneth Sprout, FNP  Introduced to nurse practitioner role and practice setting.  All questions answered.  Discussed provider/patient relationship and expectations.  I,Tiffany J Bragg,acting as a scribe for Gwyneth Sprout, FNP.,have documented all relevant documentation on the behalf of Gwyneth Sprout, FNP,as directed by  Gwyneth Sprout, FNP while in the presence of Gwyneth Sprout, FNP.  Chief Complaint  Patient presents with   Annual Exam   Subjective    Emma Villarreal is a 81 y.o. female who presents today for a complete physical exam.  She reports consuming a general diet. Home exercise routine includes walking. She generally feels well. She reports sleeping well. She does  have additional problems to discuss today. Wants to discuss thyroid function due to hair loss.   HPI  Past Medical History:  Diagnosis Date   A-fib [facility])    Allergy    Arthritis    Cataract    COPD (chronic obstructive pulmonary disease) ([place])    patient reported not validated   Hyperlipidemia    Hypothyroidism    Osteopenia    Past Surgical History:  Procedure Laterality Date   CHOLECYSTECTOMY     VAGINAL HYSTERECTOMY     Social History   Socioeconomic History   Marital status: Widowed    Spouse name: Not on file   Number of children: 5   Years of education: Not on file   Highest education level: High school graduate  Occupational History   Occupation: retired    Comment: Glass blower/designer   Tobacco Use   Smoking status: Every Day    Packs/day: 1.00    Years: 60.00    Total pack years: 60.00    Types: Cigarettes   Smokeless tobacco: Never  Vaping Use   Vaping Use: Never used  Substance and Sexual Activity   Alcohol use: Not Currently   Drug use: Never   Sexual activity: Not Currently  Other Topics Concern   Not on file  Social History  Narrative   Not on file   Social Determinants of Health   Financial Resource Strain: Low Risk  (06/25/2021)   Overall Financial Resource Strain (CARDIA)    Difficulty of Paying Living Expenses: Not hard at all  Food Insecurity: No Food Insecurity (06/25/2021)   Hunger Vital Sign    Worried About Running Out of Food in the Last Year: Never true    [place] in the Last Year: Never true  Transportation Needs: No Transportation Needs (06/25/2021)   PRAPARE - Hydrologist (Medical): No    Lack of Transportation (Non-Medical): No  Physical Activity: Inactive (06/25/2021)   Exercise Vital Sign    Days of Exercise per Week: 0 days    Minutes of Exercise per Session: 0 min  Stress: No Stress Concern Present (06/25/2021)   [place]    Feeling of Stress : Not at all  Social Connections: Socially Isolated (06/25/2021)   Social Connection and Isolation Panel [NHANES]    Frequency of Communication with Friends and Family: More than three times a week    Frequency of Social Gatherings with Friends and Family: Once a week    Attends Religious Services: Never    Marine scientist or Organizations: No  Attends Archivist Meetings: Never    Marital Status: Widowed  Intimate Partner Violence: Not At Risk (06/25/2021)   Humiliation, Afraid, Rape, and Kick questionnaire    Fear of Current or Ex-Partner: No    Emotionally Abused: No    Physically Abused: No    Sexually Abused: No   Family Status  Relation Name Status   Daughter  (Not Specified)   MGM  Deceased   Mother  Deceased   Daughter  (Not Specified)   Mat Aunt  (Not Specified)   Neg Hx  (Not Specified)   Family History  Problem Relation Age of Onset   Diabetes Daughter    Stroke Maternal Grandmother    Stroke Mother    Dementia Mother    Diabetes Daughter    Colon cancer Maternal Aunt    Breast cancer Neg Hx    Allergies   Allergen Reactions   Molds & Smuts Anaphylaxis   Varenicline Rash    Patient Care Team: Bacigalupo, Dionne Bucy, MD as PCP - General (Family Medicine) Rockey Situ, Kathlene November, MD as PCP - Cardiology (Cardiology) Minna Merritts, MD as Consulting Physician (Cardiology) Pa, [place] (Optometry)   Medications: Outpatient Medications Prior to Visit  Medication Sig   apixaban (ELIQUIS) 5 MG TABS tablet Take 1 tablet (5 mg total) by mouth 2 (two) times daily.   atorvastatin (LIPITOR) 10 MG tablet Take 1 tablet (10 mg total) by mouth daily. Take one tablet PO at bedtime   BINAXNOW COVID-19 AG HOME TEST KIT    Cholecalciferol 50 MCG (2000 UT) TABS Take by mouth daily.   Cyanocobalamin (B-12 PO) Take by mouth daily in the afternoon.   ELDERBERRY PO Take by mouth daily at 8 pm.   levothyroxine (SYNTHROID) 50 MCG tablet TAKE 1 TABLET BY MOUTH BEFORE BREAKFAST   metoprolol succinate (TOPROL-XL) 25 MG 24 hr tablet Take 1 tablet (25 mg total) by mouth daily.   metoprolol tartrate (LOPRESSOR) 25 MG tablet Take 0.5 tablets (12.5 mg total) by mouth as needed. Take for tachycardiac   montelukast (SINGULAIR) 10 MG tablet Take 1 tablet (10 mg total) by mouth at bedtime.   oxybutynin (DITROPAN-XL) 5 MG 24 hr tablet Take 1 tablet (5 mg total) by mouth at bedtime.   No facility-administered medications prior to visit.    Review of Systems   Objective    BP (!) 117/57 (BP Location: Left Arm, Patient Position: Sitting, Cuff Size: Normal)   Pulse 65   Resp 16   Ht _0  (1.575 m)   Wt 138 lb (62.6 kg)   SpO2 96%   BMI 25.24 kg/m    Physical Exam Vitals and nursing note reviewed.  Constitutional:      General: She is awake. She is not in acute distress.    Appearance: Normal appearance. She is well-developed, well-groomed and overweight. She is not ill-appearing, toxic-appearing or diaphoretic.  HENT:     Head: Normocephalic and atraumatic.     Jaw: There is normal jaw occlusion. No  trismus, tenderness, swelling or pain on movement.     Right Ear: Hearing, tympanic membrane, ear canal and external ear normal. There is no impacted cerumen.     Left Ear: Hearing, tympanic membrane, ear canal and external ear normal. There is no impacted cerumen.     Nose: Nose normal. No congestion or rhinorrhea.     Right Turbinates: Not enlarged, swollen or pale.     Left Turbinates: Not enlarged,  swollen or pale.     Right Sinus: No maxillary sinus tenderness or frontal sinus tenderness.     Left Sinus: No maxillary sinus tenderness or frontal sinus tenderness.     Mouth/Throat:     Lips: Pink.     Mouth: Mucous membranes are moist. No injury.     Tongue: No lesions.     Pharynx: Oropharynx is clear. Uvula midline. No pharyngeal swelling, oropharyngeal exudate, posterior oropharyngeal erythema or uvula swelling.     Tonsils: No tonsillar exudate or tonsillar abscesses.  Eyes:     General: Lids are normal. Lids are everted, no foreign bodies appreciated. Vision grossly intact. Gaze aligned appropriately. No allergic shiner or visual field deficit.       Right eye: No discharge.        Left eye: No discharge.     Extraocular Movements: Extraocular movements intact.     Conjunctiva/sclera: Conjunctivae normal.     Right eye: Right conjunctiva is not injected. No exudate.    Left eye: Left conjunctiva is not injected. No exudate.    Pupils: Pupils are equal, round, and reactive to light.  Neck:     Thyroid: No thyroid mass, thyromegaly or thyroid tenderness.     Vascular: No carotid bruit.     Trachea: Trachea normal.  Cardiovascular:     Rate and Rhythm: Normal rate and regular rhythm.     Pulses: Normal pulses.          Carotid pulses are 2+ on the right side and 2+ on the left side.      Radial pulses are 2+ on the right side and 2+ on the left side.       Dorsalis pedis pulses are 2+ on the right side and 2+ on the left side.       Posterior tibial pulses are 2+ on the right  side and 2+ on the left side.     Heart sounds: Normal heart sounds, S1 normal and S2 normal. No murmur heard.    No friction rub. No gallop.  Pulmonary:     Effort: Pulmonary effort is normal. No respiratory distress.     Breath sounds: Normal breath sounds and air entry. No stridor. No wheezing, rhonchi or rales.  Chest:     Chest wall: No tenderness.  Abdominal:     General: Abdomen is flat. Bowel sounds are normal. There is no distension.     Palpations: Abdomen is soft. There is no mass.     Tenderness: There is no abdominal tenderness. There is no right CVA tenderness, left CVA tenderness, guarding or rebound.     Hernia: No hernia is present.  Genitourinary:    Comments: Exam deferred; denies complaints Musculoskeletal:        General: No swelling, tenderness, deformity or signs of injury. Normal range of motion.     Cervical back: Full passive range of motion without pain, normal range of motion and neck supple. No edema, rigidity or tenderness. No muscular tenderness.     Right lower leg: No edema.     Left lower leg: No edema.  Lymphadenopathy:     Cervical: No cervical adenopathy.     Right cervical: No superficial, deep or posterior cervical adenopathy.    Left cervical: No superficial, deep or posterior cervical adenopathy.  Skin:    General: Skin is warm and dry.     Capillary Refill: Capillary refill takes less than 2 seconds.  Coloration: Skin is not jaundiced or pale.     Findings: No bruising, erythema, lesion or rash.  Neurological:     General: No focal deficit present.     Mental Status: She is alert and oriented to person, place, and time. Mental status is at baseline.     GCS: GCS eye subscore is 4. GCS verbal subscore is 5. GCS motor subscore is 6.     Sensory: Sensation is intact. No sensory deficit.     Motor: Motor function is intact. No weakness.     Coordination: Coordination is intact. Coordination normal.     Gait: Gait is intact. Gait normal.   Psychiatric:        Attention and Perception: Attention and perception normal.        Mood and Affect: Mood and affect normal.        Speech: Speech normal.        Behavior: Behavior normal. Behavior is cooperative.        Thought Content: Thought content normal.        Cognition and Memory: Cognition and memory normal.        Judgment: Judgment normal.      Last depression screening scores    02/28/2022    4:03 PM 06/25/2021   11:06 AM 06/19/2020   10:50 AM  PHQ 2/9 Scores  PHQ - 2 Score 0 1 1  PHQ- 9 Score 2     Last fall risk screening    02/28/2022    4:03 PM  [place] in the past year? 0  Number falls in past yr: 0  Injury with Fall? 0  Risk for fall due to : No Fall Risks  Follow up Falls evaluation completed   Last Audit-C alcohol use screening    02/28/2022    4:04 PM  Alcohol Use Disorder Test (AUDIT)  1. How often do you have a drink containing alcohol? 0  2. How many drinks containing alcohol do you have on a typical day when you are drinking? 0  3. How often do you have six or more drinks on one occasion? 0  AUDIT-C Score 0   A score of 3 or more in women, and 4 or more in men indicates increased risk for alcohol abuse, EXCEPT if all of the points are from question 1   No results found for any visits on 02/28/22.  Assessment & Plan    Routine Health Maintenance and Physical Exam  Exercise Activities and Dietary recommendations  Goals      DIET - EAT MORE FRUITS AND VEGETABLES     Quit Smoking     Recommend to continue efforts to reduce smoking habits until no longer smoking.         Immunization History  Administered Date(s) Administered   Fluad Quad(high Dose 65+) 02/28/2022   Influenza, High Dose Seasonal PF 02/09/2013, 06/12/2015, 02/02/2016, 01/21/2019, 05/02/2020   PFIZER(Purple Top)SARS-COV-2 Vaccination 05/17/2019, 06/07/2019, 02/26/2020   PNEUMOCOCCAL CONJUGATE-20 11/07/2020   Pneumococcal Polysaccharide-23 08/02/2009    Zoster, Live 08/02/2009    Health Maintenance  Topic Date Due   COVID-19 Vaccine (4 - Pfizer series) 03/16/2022 (Originally 04/22/2020)   Zoster Vaccines- Shingrix (1 of 2) 05/31/2022 (Originally 12/16/1990)   TETANUS/TDAP  03/01/2023 (Originally 12/16/1959)   Medicare Annual Wellness (AWV)  06/26/2022   DEXA SCAN  07/21/2023   Pneumonia Vaccine 20+ Years old  Completed   INFLUENZA VACCINE  Completed   HPV VACCINES  Aged Out    Discussed health benefits of physical activity, and encouraged her to engage in regular exercise appropriate for her age and condition.  Problem List Items Addressed This Visit       Endocrine   Hypothyroidism due to acquired atrophy of thyroid    Chronic, previous stable on 50 mcg Notes that she is losing hair and wishes to have thyroid tests again      Relevant Orders   TSH + free T4     Hematopoietic and Hemostatic   Acquired thrombophilia ([place])    Chronic, stable Hx of Afib; followed by [facility]       Relevant Orders   CBC   Comprehensive Metabolic Panel (CMET)     Other   Annual physical exam - Primary    UTD on dental and vision Things to do to keep yourself healthy  - Exercise at least 30-45 minutes a day, 3-4 days a week.  - Eat a low-fat diet with lots of fruits and vegetables, up to 7-9 servings per day.  - Seatbelts can save your life. Wear them always.  - Smoke detectors on every level of your home, check batteries every year.  - Eye Doctor - have an eye exam every 1-2 years  - Safe sex - if you may be exposed to STDs, use a condom.  - Alcohol -  If you drink, do it moderately, less than 2 drinks per day.  - [place]. Choose someone to speak for you if you are not able.  - Depression is common in our stressful world.If you're feeling down or losing interest in things you normally enjoy, please come in for a visit.  - Violence - If anyone is threatening or hurting you, please call immediately.       Relevant  Orders   CBC   Comprehensive Metabolic Panel (CMET)   Lipid panel   Hair loss    Acute concern; will check basic labs, thyroid and vitamins to assist       Relevant Orders   TSH + free T4   CBC   Comprehensive Metabolic Panel (CMET)   Vitamin D (25 hydroxy)   B12 and Folate Panel   Mixed hyperlipidemia    Chronic, previous stable On 10 mg lipitor currently       Relevant Orders   Lipid panel   Need for influenza vaccination   Relevant Orders   Flu Vaccine QUAD High Dose(Fluad) (Completed)   Vitamin D deficiency    Chronic condition; previously managed with supplements Now with acute on chronic hair loss Repeat Vit D labs      Relevant Orders   Vitamin D (25 hydroxy)   Return in about 6 months (around 08/29/2022) for chonic disease management.    Vonna Kotyk, FNP, have reviewed all documentation for this visit. The documentation on 02/28/22 for the exam, diagnosis, procedures, and orders are all accurate and complete.  Gwyneth Sprout, [place] [phone] (phone) [phone] (fax)  [place]

## 2022-02-28 NOTE — Assessment & Plan Note (Signed)
Chronic, previous stable On 10 mg lipitor currently

## 2022-02-28 NOTE — Assessment & Plan Note (Signed)
Chronic condition; previously managed with supplements Now with acute on chronic hair loss Repeat Vit D labs

## 2022-02-28 NOTE — Assessment & Plan Note (Signed)
Chronic, stable Hx of Afib; followed by [facility]

## 2022-02-28 NOTE — Assessment & Plan Note (Signed)
UTD on dental and vision Things to do to keep yourself healthy  - Exercise at least 30-45 minutes a day, 3-4 days a week.  - Eat a low-fat diet with lots of fruits and vegetables, up to 7-9 servings per day.  - Seatbelts can save your life. Wear them always.  - Smoke detectors on every level of your home, check batteries every year.  - Eye Doctor - have an eye exam every 1-2 years  - Safe sex - if you may be exposed to STDs, use a condom.  - Alcohol -  If you drink, do it moderately, less than 2 drinks per day.  - Health Care Power of Attorney. Choose someone to speak for you if you are not able.  - Depression is common in our stressful world.If you're feeling down or losing interest in things you normally enjoy, please come in for a visit.  - Violence - If anyone is threatening or hurting you, please call immediately.  

## 2022-02-28 NOTE — Assessment & Plan Note (Signed)
Acute concern; will check basic labs, thyroid and vitamins to assist

## 2022-02-28 NOTE — Assessment & Plan Note (Signed)
Chronic, previous stable on 50 mcg Notes that she is losing hair and wishes to have thyroid tests again

## 2022-03-01 LAB — B12 AND FOLATE PANEL
Folate: 16.5 ng/mL (ref >3.0)
Vitamin B-12: 1049 pg/mL (ref 232–1245)

## 2022-03-01 LAB — LIPID PANEL
Chol/HDL Ratio: 2.2 ratio (ref 0.0–4.4)
Cholesterol, Total: 130 mg/dL (ref 100–199)
HDL: 58 mg/dL (ref >39)
LDL Chol Calc (NIH): 52 mg/dL (ref 0–99)
Triglycerides: 112 mg/dL (ref 0–149)
VLDL Cholesterol Cal: 20 mg/dL (ref 5–40)

## 2022-03-01 LAB — COMPREHENSIVE METABOLIC PANEL
ALT: 26 IU/L (ref 0–32)
AST: 26 IU/L (ref 0–40)
Albumin/Globulin Ratio: 1.5 (ref 1.2–2.2)
Albumin: 4.5 g/dL (ref 3.7–4.7)
Alkaline Phosphatase: 66 IU/L (ref 44–121)
BUN/Creatinine Ratio: 10 — ABNORMAL LOW (ref 12–28)
BUN: 9 mg/dL (ref 8–27)
Bilirubin Total: 0.4 mg/dL (ref 0.0–1.2)
CO2: 28 mmol/L (ref 20–29)
Calcium: 10.1 mg/dL (ref 8.7–10.3)
Chloride: 100 mmol/L (ref 96–106)
Creatinine, Ser: 0.88 mg/dL (ref 0.57–1.00)
Globulin, Total: 3 g/dL (ref 1.5–4.5)
Glucose: 95 mg/dL (ref 70–99)
Potassium: 4.9 mmol/L (ref 3.5–5.2)
Sodium: 140 mmol/L (ref 134–144)
Total Protein: 7.5 g/dL (ref 6.0–8.5)
eGFR: 66 mL/min/{1.73_m2} (ref >59)

## 2022-03-01 LAB — TSH+FREE T4
Free T4: 1.34 ng/dL (ref 0.82–1.77)
TSH: 1.66 u[IU]/mL (ref 0.450–4.500)

## 2022-03-01 LAB — CBC
Hematocrit: 40.9 % (ref 34.0–46.6)
Hemoglobin: 13.2 g/dL (ref 11.1–15.9)
MCH: 32.1 pg (ref 26.6–33.0)
MCHC: 32.3 g/dL (ref 31.5–35.7)
MCV: 100 fL — ABNORMAL HIGH (ref 79–97)
Platelets: 222 10*3/uL (ref 150–450)
RBC: 4.11 x10E6/uL (ref 3.77–5.28)
RDW: 12.2 % (ref 11.7–15.4)
WBC: 5.6 10*3/uL (ref 3.4–10.8)

## 2022-03-01 LAB — VITAMIN D 25 HYDROXY (VIT D DEFICIENCY, FRACTURES): Vit D, 25-Hydroxy: 42.1 ng/mL (ref 30.0–100.0)

## 2022-03-01 NOTE — Progress Notes (Signed)
All labs are normal and stable; nothing notes the cause of hair loss. Recommend follow up with dermatology for hair shaft analysis.  Jacky Kindle, FNP  [facility] [address] [place], [place] [address] [phone] (phone) [phone] (fax) [facility]

## 2022-06-12 ENCOUNTER — Telehealth: Payer: Self-pay | Admitting: Family Medicine

## 2022-06-12 NOTE — Telephone Encounter (Signed)
Contacted Gem Sayson to schedule their annual wellness visit. Appointment made for 07/22/2022.  [place] Direct Dial: [phone]

## 2022-06-20 ENCOUNTER — Encounter: Payer: Self-pay | Admitting: Family Medicine

## 2022-06-20 ENCOUNTER — Ambulatory Visit (INDEPENDENT_AMBULATORY_CARE_PROVIDER_SITE_OTHER): Payer: Medicare Other | Admitting: Family Medicine

## 2022-06-20 VITALS — BP 144/72 | HR 71 | Temp 98.5°F | Resp 14 | Ht 62.0 in | Wt 135.9 lb

## 2022-06-20 DIAGNOSIS — M5442 Lumbago with sciatica, left side: Secondary | ICD-10-CM

## 2022-06-20 MED ORDER — PREDNISONE 10 MG PO TABS: ORAL_TABLET | ORAL | 0 refills | Status: DC

## 2022-06-20 NOTE — Progress Notes (Signed)
I,Sulibeya S Dimas,acting as a Education administrator for Lavon Paganini, MD.,have documented all relevant documentation on the behalf of Lavon Paganini, MD,as directed by  Lavon Paganini, MD while in the presence of Lavon Paganini, MD.     Established patient visit   Patient: Emma Villarreal   DOB: May 30, 1940   82 y.o. Female  MRN: [id number] Visit Date: 06/20/2022  Today's healthcare provider: Lavon Paganini, MD   Chief Complaint  Patient presents with   Back Pain   Subjective    HPI   L back pain started yesterday Hasn't tried anything for it No known injury or trauma  Going down L leg past the knee Worse with turning to the left or twisting No weakness or numbness Lower back hurts all of the time but not usually like this.   Medications: Outpatient Medications Prior to Visit  Medication Sig   apixaban (ELIQUIS) 5 MG TABS tablet Take 1 tablet (5 mg total) by mouth 2 (two) times daily.   atorvastatin (LIPITOR) 10 MG tablet Take 1 tablet (10 mg total) by mouth daily. Take one tablet PO at bedtime   BINAXNOW COVID-19 AG HOME TEST KIT    Cholecalciferol 50 MCG (2000 UT) TABS Take by mouth daily.   Cyanocobalamin (B-12 PO) Take by mouth daily in the afternoon.   ELDERBERRY PO Take by mouth daily at 8 pm.   levothyroxine (SYNTHROID) 50 MCG tablet TAKE 1 TABLET BY MOUTH BEFORE BREAKFAST   metoprolol succinate (TOPROL-XL) 25 MG 24 hr tablet Take 1 tablet (25 mg total) by mouth daily.   metoprolol tartrate (LOPRESSOR) 25 MG tablet Take 0.5 tablets (12.5 mg total) by mouth as needed. Take for tachycardiac   montelukast (SINGULAIR) 10 MG tablet Take 1 tablet (10 mg total) by mouth at bedtime.   oxybutynin (DITROPAN-XL) 5 MG 24 hr tablet Take 1 tablet (5 mg total) by mouth at bedtime.   No facility-administered medications prior to visit.    Review of Systems per HPI     Objective    BP (!) 144/72 (BP Location: Left Arm, Patient Position: Sitting, Cuff Size: Normal)   Pulse  71   Temp 98.5 F (36.9 C) (Oral)   Resp 14   Ht '5\' 2"'$  (1.575 m)   Wt 135 lb 14.4 oz (61.6 kg)   BMI 24.86 kg/m    Physical Exam Vitals reviewed.  Constitutional:      General: She is not in acute distress.    Appearance: She is well-developed.  HENT:     Head: Normocephalic and atraumatic.  Eyes:     General: No scleral icterus.    Conjunctiva/sclera: Conjunctivae normal.  Cardiovascular:     Rate and Rhythm: Normal rate and regular rhythm.  Pulmonary:     Effort: Pulmonary effort is normal. No respiratory distress.  Musculoskeletal:     Comments: Back: No midline TTP, ROM grossly intact.  Mild TTP over L lower back/SI joint. Negative SLR bilaterally.  Strength and sensation to light touch intact in lower extremities.    Skin:    General: Skin is warm and dry.     Findings: No rash.  Neurological:     Mental Status: She is alert and oriented to person, place, and time.  Psychiatric:        Behavior: Behavior normal.       No results found for any visits on 06/20/22.  Assessment & Plan     1. Acute left-sided low back pain with left-sided sciatica -  new problem - no red flags - no indication for imaging at this time - will treat with prednisone taper - HEP after starting to improve - avoid NSAIDs - ok to use ice/heat - return precautions discussed  Meds ordered this encounter  Medications   predniSONE (DELTASONE) 10 MG tablet    Sig: Take '60mg'$  PO daily x2d, then '50mg'$  daily x2d, then '40mg'$  daily x2d, then '30mg'$  daily x2d, then '20mg'$  daily x2d, then '10mg'$  daily x2d, then stop    Dispense:  42 tablet    Refill:  0     Return for as scheduled.      I, Lavon Paganini, MD, have reviewed all documentation for this visit. The documentation on 06/20/22 for the exam, diagnosis, procedures, and orders are all accurate and complete.   Suhan Paci, Dionne Bucy, MD, MPH [facility]

## 2022-07-10 ENCOUNTER — Ambulatory Visit: Payer: Medicare Other | Admitting: Physician Assistant

## 2022-07-11 ENCOUNTER — Encounter: Payer: Self-pay | Admitting: Physician Assistant

## 2022-07-11 ENCOUNTER — Ambulatory Visit
Admission: RE | Admit: 2022-07-11 | Discharge: 2022-07-11 | Disposition: A | Discharge status: Home / Self Care | Payer: Medicare Other | Attending: Physician Assistant | Admitting: Physician Assistant

## 2022-07-11 ENCOUNTER — Ambulatory Visit
Admission: RE | Admit: 2022-07-11 | Discharge: 2022-07-11 | Disposition: A | Discharge status: Home / Self Care | Payer: Medicare Other | Source: Ambulatory Visit | Attending: Physician Assistant | Admitting: Physician Assistant

## 2022-07-11 ENCOUNTER — Ambulatory Visit (INDEPENDENT_AMBULATORY_CARE_PROVIDER_SITE_OTHER): Payer: Medicare Other | Admitting: Physician Assistant

## 2022-07-11 VITALS — BP 133/71 | HR 91 | Wt 137.9 lb

## 2022-07-11 DIAGNOSIS — M25552 Pain in left hip: Secondary | ICD-10-CM | POA: Diagnosis not present

## 2022-07-11 DIAGNOSIS — M47816 Spondylosis without myelopathy or radiculopathy, lumbar region: Secondary | ICD-10-CM | POA: Diagnosis not present

## 2022-07-11 DIAGNOSIS — M545 Low back pain, unspecified: Secondary | ICD-10-CM | POA: Diagnosis not present

## 2022-07-11 MED ORDER — TRAMADOL HCL 50 MG PO TABS: 50.0000 mg | ORAL_TABLET | Freq: Three times a day (TID) | ORAL | 0 refills | Status: AC | PRN

## 2022-07-11 NOTE — Progress Notes (Signed)
I,Sha'taria Tyson,acting as a Education administrator for [employer], PA-C.,have documented all relevant documentation on the behalf of Mikey Kirschner, PA-C,as directed by  Mikey Kirschner, PA-C while in the presence of Mikey Kirschner, PA-C.   Established patient visit   Patient: Emma Villarreal   DOB: [birth year]   82 y.o. Female  MRN: [id number] Visit Date: 07/11/2022  Today's healthcare provider: Mikey Kirschner, PA-C   Cc. Hip/buttock pain x 3 weeks  Subjective    HPI  Patient is being seen due to left hip pain X 3 weeks. She reports no improvement with the prednisone. Pain is worsening. She reports she tripped over a rug last week as she wasn't able to pick up her foot due to pain. She landed on her knees, no head injury. Denies any falls or injury before hip pain started. Denies numbness, bowel or bladder difficulty.   Medications: Outpatient Medications Prior to Visit  Medication Sig   apixaban (ELIQUIS) 5 MG TABS tablet Take 1 tablet (5 mg total) by mouth 2 (two) times daily.   atorvastatin (LIPITOR) 10 MG tablet Take 1 tablet (10 mg total) by mouth daily. Take one tablet PO at bedtime   BINAXNOW COVID-19 AG HOME TEST KIT    Cholecalciferol 50 MCG (2000 UT) TABS Take by mouth daily.   Cyanocobalamin (B-12 PO) Take by mouth daily in the afternoon.   ELDERBERRY PO Take by mouth daily at 8 pm.   levothyroxine (SYNTHROID) 50 MCG tablet TAKE 1 TABLET BY MOUTH BEFORE BREAKFAST   metoprolol succinate (TOPROL-XL) 25 MG 24 hr tablet Take 1 tablet (25 mg total) by mouth daily.   metoprolol tartrate (LOPRESSOR) 25 MG tablet Take 0.5 tablets (12.5 mg total) by mouth as needed. Take for tachycardiac   montelukast (SINGULAIR) 10 MG tablet Take 1 tablet (10 mg total) by mouth at bedtime.   oxybutynin (DITROPAN-XL) 5 MG 24 hr tablet Take 1 tablet (5 mg total) by mouth at bedtime.   [DISCONTINUED] predniSONE (DELTASONE) 10 MG tablet Take 60mg  PO daily x2d, then 50mg  daily x2d, then 40mg  daily x2d, then 30mg   daily x2d, then 20mg  daily x2d, then 10mg  daily x2d, then stop   No facility-administered medications prior to visit.    Review of Systems  Constitutional:  Negative for fatigue and fever.  Respiratory:  Negative for cough and shortness of breath.   Cardiovascular:  Negative for chest pain and leg swelling.  Gastrointestinal:  Negative for abdominal pain.  Musculoskeletal:  Positive for arthralgias and gait problem.  Neurological:  Negative for dizziness and headaches.       Objective    BP 133/71 (BP Location: Left Arm, Patient Position: Sitting, Cuff Size: Normal)   Pulse 91   Wt 137 lb 14.4 oz (62.6 kg)   SpO2 95%   BMI 25.22 kg/m   Physical Exam Vitals reviewed.  Constitutional:      Appearance: She is not ill-appearing.  HENT:     Head: Normocephalic.  Eyes:     Conjunctiva/sclera: Conjunctivae normal.  Cardiovascular:     Rate and Rhythm: Normal rate.  Pulmonary:     Effort: Pulmonary effort is normal. No respiratory distress.  Musculoskeletal:     Comments: Tender on left buttock. No visible lower back rash Pt gait-- leaning forward, shuffling   Neurological:     General: No focal deficit present.     Mental Status: She is alert and oriented to person, place, and time.  Psychiatric:  Mood and Affect: Mood normal.        Behavior: Behavior normal.      No results found for any visits on 07/11/22.  Assessment & Plan     Left hip/buttock pain Still reading as sciatica to me but concerned as pt has fallen, no improvement with prednisone Ordered lumbar/L hip films Rx tramadol for pain -- advised take 1-3 times a day prn, only rx #15  Will contact with further recommendations after imaging results  Return if symptoms worsen or fail to improve.      I, Mikey Kirschner, PA-C have reviewed all documentation for this visit. The documentation on  07/11/22 for the exam, diagnosis, procedures, and orders are all accurate and complete.  Mikey Kirschner,  PA-C [facility] [address] [place], [place], [address] Office: [phone] Fax: [place]'

## 2022-07-12 ENCOUNTER — Telehealth: Payer: Self-pay

## 2022-07-12 NOTE — Telephone Encounter (Signed)
Copied from [place] [id number]. Topic: General - Other >> Jul 12, 2022 12:45 PM Jacinto Reap M wrote: Reason for CRM: Pt called for x-ray results. Cb# [phone]

## 2022-07-15 ENCOUNTER — Other Ambulatory Visit: Payer: Self-pay

## 2022-07-15 ENCOUNTER — Telehealth: Payer: Self-pay | Admitting: Family Medicine

## 2022-07-15 DIAGNOSIS — M25552 Pain in left hip: Secondary | ICD-10-CM

## 2022-07-15 NOTE — Telephone Encounter (Unsigned)
Copied from [place] [id number]. Topic: General - Other >> Jul 15, 2022 11:59 AM Everette C wrote: Reason for CRM: The patient would like to be contacted by a member of clinical staff to review their recent xray results The agent was unable to connect the patient with NT via phone at the time of call Please contact the patient further when possible

## 2022-07-15 NOTE — Telephone Encounter (Signed)
Lvmtcb. Crm created. Ok for [facility] to advise

## 2022-07-15 NOTE — Telephone Encounter (Signed)
Duplicate encounter. Please see telephone encounter from 07/12/22. CRM created. OK for [facility] to advise per Mikey Kirschner.   [facility] nurse box added as fyi in case encounter is routed to them again

## 2022-07-15 NOTE — Telephone Encounter (Signed)
Please Review

## 2022-07-18 ENCOUNTER — Telehealth: Payer: Self-pay | Admitting: *Deleted

## 2022-07-18 NOTE — Telephone Encounter (Signed)
Patient reports she missed a call from "this #" from office. Last documented call was from 07/15/22. Patient reports she is aware of her recent x ray results. Please advise if patient needs additional information from provider.

## 2022-07-19 ENCOUNTER — Telehealth: Payer: Self-pay | Admitting: Family Medicine

## 2022-07-19 NOTE — Telephone Encounter (Signed)
Appointment rescheduled and made a telephone visit per pt request.

## 2022-07-19 NOTE — Telephone Encounter (Signed)
Pt is calling to reschedule her AWV to a telephone appt. Please advise [phone]

## 2022-07-23 DIAGNOSIS — M7062 Trochanteric bursitis, left hip: Secondary | ICD-10-CM | POA: Diagnosis not present

## 2022-08-15 DIAGNOSIS — M25552 Pain in left hip: Secondary | ICD-10-CM | POA: Diagnosis not present

## 2022-08-15 DIAGNOSIS — M5459 Other low back pain: Secondary | ICD-10-CM | POA: Diagnosis not present

## 2022-08-26 DIAGNOSIS — M25552 Pain in left hip: Secondary | ICD-10-CM | POA: Insufficient documentation

## 2022-08-26 DIAGNOSIS — M545 Low back pain, unspecified: Secondary | ICD-10-CM | POA: Insufficient documentation

## 2022-08-26 DIAGNOSIS — M5459 Other low back pain: Secondary | ICD-10-CM | POA: Diagnosis not present

## 2022-08-29 ENCOUNTER — Encounter: Payer: Self-pay | Admitting: Family Medicine

## 2022-08-29 ENCOUNTER — Ambulatory Visit (INDEPENDENT_AMBULATORY_CARE_PROVIDER_SITE_OTHER): Payer: Medicare Other | Admitting: Family Medicine

## 2022-08-29 VITALS — BP 116/68 | HR 77 | Temp 98.0°F | Resp 16 | Ht 62.0 in | Wt 134.9 lb

## 2022-08-29 DIAGNOSIS — E039 Hypothyroidism, unspecified: Secondary | ICD-10-CM

## 2022-08-29 DIAGNOSIS — I480 Paroxysmal atrial fibrillation: Secondary | ICD-10-CM | POA: Diagnosis not present

## 2022-08-29 DIAGNOSIS — D6869 Other thrombophilia: Secondary | ICD-10-CM

## 2022-08-29 DIAGNOSIS — N3281 Overactive bladder: Secondary | ICD-10-CM | POA: Diagnosis not present

## 2022-08-29 DIAGNOSIS — E782 Mixed hyperlipidemia: Secondary | ICD-10-CM | POA: Diagnosis not present

## 2022-08-29 MED ORDER — OXYBUTYNIN CHLORIDE ER 10 MG PO TB24: 10.0000 mg | ORAL_TABLET | Freq: Every day | ORAL | 1 refills | Status: DC

## 2022-08-29 NOTE — Assessment & Plan Note (Signed)
Chronic and uncontrolled with worsening urgency No nocturia, no UTI symptoms Increase oxybutinin to 10 mg daily

## 2022-08-29 NOTE — Assessment & Plan Note (Signed)
From A. fib °Continue Eliquis °Recheck CBC °

## 2022-08-29 NOTE — Assessment & Plan Note (Signed)
Previously well controlled Continue Synthroid at current dose  Recheck TSH and adjust Synthroid as indicated   

## 2022-08-29 NOTE — Assessment & Plan Note (Signed)
Chronic, stable In NSR today Continue toprol and eliquis F/b cardiology

## 2022-08-29 NOTE — Progress Notes (Signed)
I,Sulibeya S Dimas,acting as a Neurosurgeon for Shirlee Latch, MD.,have documented all relevant documentation on the behalf of Shirlee Latch, MD,as directed by  Shirlee Latch, MD while in the presence of Shirlee Latch, MD.     Established patient visit   Patient: Emma Villarreal   DOB: 10/22/1940   82 y.o. Female  MRN: [id number] Visit Date: 08/29/2022  Today's healthcare provider: Shirlee Latch, MD   Chief Complaint  Patient presents with   Hypothyroidism   Hypertension   Hyperlipidemia   Subjective    HPI  Hypertension, follow-up  BP Readings from Last 3 Encounters:  07/11/22 133/71  06/20/22 (!) 144/72  02/28/22 (!) 117/57   Wt Readings from Last 3 Encounters:  07/11/22 137 lb 14.4 oz (62.6 kg)  06/20/22 135 lb 14.4 oz (61.6 kg)  02/28/22 138 lb (62.6 kg)     She was last seen for hypertension 6 months ago.  BP at that visit was 117/57. Management since that visit includes no changes. She reports excellent compliance with treatment. She is not having side effects.   --------------------------------------------------------------------------------------------------- Lipid/Cholesterol, follow-up  Last Lipid Panel: Lab Results  Component Value Date   CHOL 130 02/28/2022   LDLCALC 52 02/28/2022   HDL 58 02/28/2022   TRIG 112 02/28/2022    She was last seen for this 6 months ago.  Management since that visit includes continue Lipitor 10 mg daily.  She reports excellent compliance with treatment. She is not having side effects.   Last metabolic panel Lab Results  Component Value Date   GLUCOSE 95 02/28/2022   NA 140 02/28/2022   K 4.9 02/28/2022   BUN 9 02/28/2022   CREATININE 0.88 02/28/2022   EGFR 66 02/28/2022   GFRNONAA 64 05/19/2020   CALCIUM 10.1 02/28/2022   AST 26 02/28/2022   ALT 26 02/28/2022   The ASCVD Risk score (Arnett DK, et al., 2019) failed to calculate for the following reasons:   The 2019 ASCVD risk score is only  valid for ages 28 to 67  --------------------------------------------------------------------------------------------------- Hypothyroid, follow-up  Lab Results  Component Value Date   TSH 1.660 02/28/2022   TSH 1.800 11/15/2021   TSH 3.830 05/18/2021   FREET4 1.34 02/28/2022   FREET4 1.22 11/15/2021    Wt Readings from Last 3 Encounters:  07/11/22 137 lb 14.4 oz (62.6 kg)  06/20/22 135 lb 14.4 oz (61.6 kg)  02/28/22 138 lb (62.6 kg)    She was last seen for hypothyroid 6 months ago.  Management since that visit includes check labs. She reports excellent compliance with treatment. She is not having side effects.  -----------------------------------------------------------------------------------------   Medications: Outpatient Medications Prior to Visit  Medication Sig   apixaban (ELIQUIS) 5 MG TABS tablet Take 1 tablet (5 mg total) by mouth 2 (two) times daily.   atorvastatin (LIPITOR) 10 MG tablet Take 1 tablet (10 mg total) by mouth daily. Take one tablet PO at bedtime   Cholecalciferol 50 MCG (2000 UT) TABS Take by mouth daily.   Cyanocobalamin (B-12 PO) Take by mouth daily in the afternoon.   ELDERBERRY PO Take by mouth daily at 8 pm.   levothyroxine (SYNTHROID) 50 MCG tablet TAKE 1 TABLET BY MOUTH BEFORE BREAKFAST   metoprolol succinate (TOPROL-XL) 25 MG 24 hr tablet Take 1 tablet (25 mg total) by mouth daily.   metoprolol tartrate (LOPRESSOR) 25 MG tablet Take 0.5 tablets (12.5 mg total) by mouth as needed. Take for tachycardiac   montelukast (SINGULAIR) 10 MG tablet  Take 1 tablet (10 mg total) by mouth at bedtime.   oxybutynin (DITROPAN-XL) 5 MG 24 hr tablet Take 1 tablet (5 mg total) by mouth at bedtime.   [DISCONTINUED] BINAXNOW COVID-19 AG HOME TEST KIT  (Patient not taking: Reported on 08/29/2022)   No facility-administered medications prior to visit.    Review of Systems  Constitutional:  Negative for appetite change, chills, diaphoresis, fatigue and  unexpected weight change.  Eyes:  Negative for visual disturbance.  Respiratory:  Negative for cough, chest tightness and shortness of breath.   Cardiovascular:  Positive for leg swelling. Negative for chest pain and palpitations.  Gastrointestinal:  Positive for abdominal pain and constipation. Negative for diarrhea, nausea and vomiting.  Endocrine: Positive for cold intolerance. Negative for heat intolerance.  Neurological:  Negative for dizziness, light-headedness and headaches.       Objective    There were no vitals taken for this visit.   Physical Exam Vitals reviewed.  Constitutional:      General: She is not in acute distress.    Appearance: Normal appearance. She is well-developed. She is not diaphoretic.  HENT:     Head: Normocephalic and atraumatic.  Eyes:     General: No scleral icterus.    Conjunctiva/sclera: Conjunctivae normal.  Neck:     Thyroid: No thyromegaly.  Cardiovascular:     Rate and Rhythm: Normal rate and regular rhythm.     Pulses: Normal pulses.     Heart sounds: Normal heart sounds. No murmur heard. Pulmonary:     Effort: Pulmonary effort is normal. No respiratory distress.     Breath sounds: Normal breath sounds. No wheezing, rhonchi or rales.  Musculoskeletal:     Cervical back: Neck supple.     Right lower leg: No edema.     Left lower leg: No edema.  Lymphadenopathy:     Cervical: No cervical adenopathy.  Skin:    General: Skin is warm and dry.     Findings: No rash.  Neurological:     Mental Status: She is alert and oriented to person, place, and time. Mental status is at baseline.  Psychiatric:        Mood and Affect: Mood normal.        Behavior: Behavior normal.       No results found for any visits on 08/29/22.  Assessment & Plan     Problem List Items Addressed This Visit       Cardiovascular and Mediastinum   Paroxysmal atrial fibrillation (HCC)    Chronic, stable In NSR today Continue toprol and eliquis F/b  cardiology        Endocrine   Hypothyroidism - Primary    Previously well controlled Continue Synthroid at current dose  Recheck TSH and adjust Synthroid as indicated        Relevant Orders   TSH     Genitourinary   OAB (overactive bladder)    Chronic and uncontrolled with worsening urgency No nocturia, no UTI symptoms Increase oxybutinin to 10 mg daily        Hematopoietic and Hemostatic   Acquired thrombophilia (HCC)    From A. fib Continue Eliquis Recheck CBC        Other   Hyperlipidemia    Previously well controlled Continue statin Repeat FLP and CMP        No follow-ups on file.      I, Shirlee Latch, MD, have reviewed all documentation for this visit. The documentation on  08/29/22 for the exam, diagnosis, procedures, and orders are all accurate and complete.   Valentine Kuechle, Marzella Schlein, MD, MPH [facility]

## 2022-08-29 NOTE — Assessment & Plan Note (Signed)
Previously well controlled Continue statin Repeat FLP and CMP  

## 2022-08-30 LAB — TSH: TSH: 1.39 u[IU]/mL (ref 0.450–4.500)

## 2022-09-09 DIAGNOSIS — M5459 Other low back pain: Secondary | ICD-10-CM | POA: Diagnosis not present

## 2022-09-09 DIAGNOSIS — M25552 Pain in left hip: Secondary | ICD-10-CM | POA: Diagnosis not present

## 2022-09-11 ENCOUNTER — Ambulatory Visit (INDEPENDENT_AMBULATORY_CARE_PROVIDER_SITE_OTHER): Payer: Medicare Other

## 2022-09-11 VITALS — Ht 64.0 in | Wt 134.0 lb

## 2022-09-11 DIAGNOSIS — Z0000 Encounter for general adult medical examination without abnormal findings: Secondary | ICD-10-CM | POA: Diagnosis not present

## 2022-09-11 NOTE — Progress Notes (Signed)
I connected with  Emma Villarreal on 09/11/22 by a audio enabled telemedicine application and verified that I am speaking with the correct person using two identifiers.  Patient Location: Home  Provider Location: Office/Clinic  I discussed the limitations of evaluation and management by telemedicine. The patient expressed understanding and agreed to proceed.   Emma Villarreal is a 82 y.o. female who presents for Medicare Annual (Subsequent) preventive examination.  Review of Systems     Cardiac Risk Factors include: advanced age (>79men, >63 women);dyslipidemia;smoking/ tobacco exposure     Objective:    Today's Vitals   09/11/22 1342  Weight: 134 lb (60.8 kg)  Height: 5\' 4"  (1.626 m)   Body mass index is 23 kg/m.     09/11/2022    1:56 PM 06/25/2021   11:08 AM 06/19/2020   10:53 AM  Advanced Directives  Does Patient Have a Medical Advance Directive? Yes No Yes  Type of Best boy of [place];Living will  Copy of Healthcare Power of Attorney in Chart?   No - copy requested  Would patient like information on creating a medical advance directive?  No - Patient declined     Current Medications (verified) Outpatient Encounter Medications as of 09/11/2022  Medication Sig   apixaban (ELIQUIS) 5 MG TABS tablet Take 1 tablet (5 mg total) by mouth 2 (two) times daily.   atorvastatin (LIPITOR) 10 MG tablet Take 1 tablet (10 mg total) by mouth daily. Take one tablet PO at bedtime   Cholecalciferol 50 MCG (2000 UT) TABS Take by mouth daily.   Cyanocobalamin (B-12 PO) Take by mouth daily in the afternoon.   ELDERBERRY PO Take by mouth daily at 8 pm.   levothyroxine (SYNTHROID) 50 MCG tablet TAKE 1 TABLET BY MOUTH BEFORE BREAKFAST   metoprolol succinate (TOPROL-XL) 25 MG 24 hr tablet Take 1 tablet (25 mg total) by mouth daily.   metoprolol tartrate (LOPRESSOR) 25 MG tablet Take 0.5 tablets (12.5 mg total) by mouth as needed. Take for tachycardiac   montelukast  (SINGULAIR) 10 MG tablet Take 1 tablet (10 mg total) by mouth at bedtime.   oxybutynin (DITROPAN-XL) 10 MG 24 hr tablet Take 1 tablet (10 mg total) by mouth at bedtime.   No facility-administered encounter medications on file as of 09/11/2022.    Allergies (verified) Molds & smuts and Varenicline   History: Past Medical History:  Diagnosis Date   A-fib [facility])    Allergy    Annual physical exam 02/28/2022   Arthritis    Cataract    COPD (chronic obstructive pulmonary disease) (HCC)    patient reported not validated   Hyperlipidemia    Hypothyroidism    Osteopenia    Past Surgical History:  Procedure Laterality Date   CHOLECYSTECTOMY     VAGINAL HYSTERECTOMY     Family History  Problem Relation Age of Onset   Diabetes Daughter    Stroke Maternal Grandmother    Stroke Mother    Dementia Mother    Diabetes Daughter    Colon cancer Maternal Aunt    Breast cancer Neg Hx    Social History   Socioeconomic History   Marital status: Widowed    Spouse name: Not on file   Number of children: 5   Years of education: Not on file   Highest education level: High school graduate  Occupational History   Occupation: retired    Comment: Print production planner   Tobacco Use   Smoking status: Every  Day    Packs/day: 1.00    Years: 60.00    Additional pack years: 0.00    Total pack years: 60.00    Types: Cigarettes   Smokeless tobacco: Never  Vaping Use   Vaping Use: Never used  Substance and Sexual Activity   Alcohol use: Not Currently   Drug use: Never   Sexual activity: Not Currently  Other Topics Concern   Not on file  Social History Narrative   Not on file   Social Determinants of Health   Financial Resource Strain: Low Risk  (09/11/2022)   Overall Financial Resource Strain (CARDIA)    Difficulty of Paying Living Expenses: Not hard at all  Food Insecurity: No Food Insecurity (09/11/2022)   Hunger Vital Sign    Worried About Running Out of Food in the Last Year: Never true     Ran Out of Food in the Last Year: Never true  Transportation Needs: No Transportation Needs (09/11/2022)   PRAPARE - Administrator, Civil Service (Medical): No    Lack of Transportation (Non-Medical): No  Physical Activity: Insufficiently Active (09/11/2022)   Exercise Vital Sign    Days of Exercise per Week: 3 days    Minutes of Exercise per Session: 30 min  Stress: No Stress Concern Present (06/25/2021)   [name] - Occupational Stress Questionnaire    Feeling of Stress : Not at all  Social Connections: Socially Isolated (09/11/2022)   Social Connection and Isolation Panel [NHANES]    Frequency of Communication with Friends and Family: More than three times a week    Frequency of Social Gatherings with Friends and Family: Once a week    Attends Religious Services: Never    Database administrator or Organizations: No    Attends Banker Meetings: Never    Marital Status: Widowed    Tobacco Counseling Ready to quit: Not Answered Counseling given: Not Answered   Clinical Intake:  Pre-visit preparation completed: Yes  Pain : No/denies pain     BMI - recorded: 23 Nutritional Status: BMI of 19-24  Normal Nutritional Risks: None Diabetes: No  How often do you need to have someone help you when you read instructions, pamphlets, or other written materials from your doctor or pharmacy?: 1 - Never  Diabetic?no  Interpreter Needed?: No  Comments: lives with daughter Information entered by :: B.Aniayah Alaniz,LPN   Activities of Daily Living    09/11/2022    1:57 PM 08/29/2022    4:17 PM  In your present state of health, do you have any difficulty performing the following activities:  Hearing? 0 0  Vision? 1 1  Difficulty concentrating or making decisions? 1 1  Walking or climbing stairs? 1 1  Dressing or bathing? 0 0  Doing errands, shopping? 0 0  Preparing Food and eating ? N   Using the Toilet? N   In the past six  months, have you accidently leaked urine? Y   Do you have problems with loss of bowel control? N   Managing your Medications? N   Managing your Finances? N   Housekeeping or managing your Housekeeping? N     Patient Care Team: Erasmo Downer, MD as PCP - General (Family Medicine) Mariah Milling Tollie Pizza, MD as PCP - Cardiology (Cardiology) Antonieta Iba, MD as Consulting Physician (Cardiology) Pa, [facility] (Optometry)  Indicate any recent Medical Services you may have received from other than [facility] providers in  the past year (date may be approximate).     Assessment:   This is a routine wellness examination for Chris.  Hearing/Vision screen Hearing Screening - Comments:: Adequate hearing Vision Screening - Comments:: Adequate vision w/glasses [facility]  Dietary issues and exercise activities discussed: Current Exercise Habits: Home exercise routine, Time (Minutes): 30, Frequency (Times/Week): 7, Weekly Exercise (Minutes/Week): 210, Intensity: Mild, Exercise limited by: cardiac condition(s)   Goals Addressed             This Visit's Progress    DIET - EAT MORE FRUITS AND VEGETABLES   On track    Quit Smoking   Not on track    Recommend to continue efforts to reduce smoking habits until no longer smoking.        Depression Screen    09/11/2022    1:49 PM 08/29/2022    4:17 PM 06/20/2022    9:15 AM 02/28/2022    4:03 PM 06/25/2021   11:06 AM 06/19/2020   10:50 AM 05/02/2020    3:29 PM  PHQ 2/9 Scores  PHQ - 2 Score 0 0 0 0 1 1 0  PHQ- 9 Score  3 5 2   3     Fall Risk    09/11/2022    1:46 PM 08/29/2022    4:17 PM 06/20/2022    9:14 AM 02/28/2022    4:03 PM 06/25/2021   11:09 AM  Fall Risk   Falls in the past year? 1 1 0 0 0  Number falls in past yr: 1 0 0 0 0  Injury with Fall? 0 0 0 0 0  Risk for fall due to : No Fall Risks History of fall(s) No Fall Risks No Fall Risks No Fall Risks  Follow up Education provided;Falls prevention discussed Falls  evaluation completed Falls evaluation completed Falls evaluation completed Falls evaluation completed    FALL RISK PREVENTION PERTAINING TO THE HOME:  Any stairs in or around the home? No  If so, are there any without handrails? No  Home free of loose throw rugs in walkways, pet beds, electrical cords, etc? Yes  Adequate lighting in your home to reduce risk of falls? Yes   ASSISTIVE DEVICES UTILIZED TO PREVENT FALLS:  Life alert? No  Use of a cane, walker or w/c? No  Grab bars in the bathroom? Yes  Shower chair or bench in shower? No  Elevated toilet seat or a handicapped toilet? No    Cognitive Function:        09/11/2022    2:00 PM  6CIT Screen  What Year? 0 points  What month? 0 points  What time? 0 points  Count back from 20 0 points  Months in reverse 0 points  Repeat phrase 0 points  Total Score 0 points    Immunizations Immunization History  Administered Date(s) Administered   Fluad Quad(high Dose 65+) 02/28/2022   Influenza, High Dose Seasonal PF 02/09/2013, 06/12/2015, 02/02/2016, 01/21/2019, 05/02/2020   PFIZER(Purple Top)SARS-COV-2 Vaccination 05/17/2019, 06/07/2019, 02/26/2020   PNEUMOCOCCAL CONJUGATE-20 11/07/2020   Pneumococcal Polysaccharide-23 08/02/2009   Zoster, Live 08/02/2009    TDAP status: Up to date  Flu Vaccine status: Up to date  Pneumococcal vaccine status: Up to date  Covid-19 vaccine status: Completed vaccines  Qualifies for Shingles Vaccine? Yes   Zostavax completed No   Shingrix Completed?: No.    Education has been provided regarding the importance of this vaccine. Patient has been advised to call insurance company to determine out  of pocket expense if they have not yet received this vaccine. Advised may also receive vaccine at local pharmacy or Health Dept. Verbalized acceptance and understanding.  Screening Tests Health Maintenance  Topic Date Due   DTaP/Tdap/Td (1 - Tdap) Never done   Zoster Vaccines- Shingrix (1 of 2)  Never done   COVID-19 Vaccine (4 - 2023-24 season) 12/21/2021   INFLUENZA VACCINE  11/21/2022   DEXA SCAN  07/21/2023   Medicare Annual Wellness (AWV)  09/11/2023   Pneumonia Vaccine 76+ Years old  Completed   HPV VACCINES  Aged Out    Health Maintenance  Health Maintenance Due  Topic Date Due   DTaP/Tdap/Td (1 - Tdap) Never done   Zoster Vaccines- Shingrix (1 of 2) Never done   COVID-19 Vaccine (4 - 2023-24 season) 12/21/2021    Colorectal cancer screening: No longer required.   Mammogram status: No longer required due to age.  Bone Density status: Completed yes. Results reflect: Bone density results: OSTEOPENIA. Repeat every 5 years.  Lung Cancer Screening: (Low Dose CT Chest recommended if Age 66-80 years, 30 pack-year currently smoking OR have quit w/in 15years.) does not qualify.   Lung Cancer Screening Referral: no  Additional Screening:  Hepatitis C Screening: does not qualify; Completed yes  Vision Screening: Recommended annual ophthalmology exams for early detection of glaucoma and other disorders of the eye. Is the patient up to date with their annual eye exam?  Yes  Who is the provider or what is the name of the office in which the patient attends annual eye exams? [facility] If pt is not established with a provider, would they like to be referred to a provider to establish care? No .   Dental Screening: Recommended annual dental exams for proper oral hygiene  Community Resource Referral / Chronic Care Management: CRR required this visit?  No   CCM required this visit?  No      Plan:     I have personally reviewed and noted the following in the patient's chart:   Medical and social history Use of alcohol, tobacco or illicit drugs  Current medications and supplements including opioid prescriptions. Patient is not currently taking opioid prescriptions. Functional ability and status Nutritional status Physical activity Advanced directives List of  other physicians Hospitalizations, surgeries, and ER visits in previous 12 months Vitals Screenings to include cognitive, depression, and falls Referrals and appointments  In addition, I have reviewed and discussed with patient certain preventive protocols, quality metrics, and best practice recommendations. A written personalized care plan for preventive services as well as general preventive health recommendations were provided to patient.     Sue Lush, LPN   1/61/0960   Nurse Notes: The patient states she is doing well and has no concerns or questions at this time.

## 2022-09-11 NOTE — Patient Instructions (Signed)
Emma Villarreal , Thank you for taking time to come for your Medicare Wellness Visit. I appreciate your ongoing commitment to your health goals. Please review the following plan we discussed and let me know if I can assist you in the future.   These are the goals we discussed:  Goals      DIET - EAT MORE FRUITS AND VEGETABLES     Quit Smoking     Recommend to continue efforts to reduce smoking habits until no longer smoking.         This is a list of the screening recommended for you and due dates:  Health Maintenance  Topic Date Due   DTaP/Tdap/Td vaccine (1 - Tdap) Never done   Zoster (Shingles) Vaccine (1 of 2) Never done   COVID-19 Vaccine (4 - 2023-24 season) 12/21/2021   Flu Shot  11/21/2022   DEXA scan (bone density measurement)  07/21/2023   Medicare Annual Wellness Visit  09/11/2023   Pneumonia Vaccine  Completed   HPV Vaccine  Aged Out    Advanced directives: yes  Conditions/risks identified: low falls risk  Next appointment: Follow up in one year for your annual wellness visit 09/17/2023 @1 :30 pm telephone   Preventive Care 65 Years and Older, Female Preventive care refers to lifestyle choices and visits with your health care provider that can promote health and wellness. What does preventive care include? A yearly physical exam. This is also called an annual well check. Dental exams once or twice a year. Routine eye exams. Ask your health care provider how often you should have your eyes checked. Personal lifestyle choices, including: Daily care of your teeth and gums. Regular physical activity. Eating a healthy diet. Avoiding tobacco and drug use. Limiting alcohol use. Practicing safe sex. Taking low-dose aspirin every day. Taking vitamin and mineral supplements as recommended by your health care provider. What happens during an annual well check? The services and screenings done by your health care provider during your annual well check will depend on your  age, overall health, lifestyle risk factors, and family history of disease. Counseling  Your health care provider may ask you questions about your: Alcohol use. Tobacco use. Drug use. Emotional well-being. Home and relationship well-being. Sexual activity. Eating habits. History of falls. Memory and ability to understand (cognition). Work and work Astronomer. Reproductive health. Screening  You may have the following tests or measurements: Height, weight, and BMI. Blood pressure. Lipid and cholesterol levels. These may be checked every 5 years, or more frequently if you are over 69 years old. Skin check. Lung cancer screening. You may have this screening every year starting at age 44 if you have a 30-pack-year history of smoking and currently smoke or have quit within the past 15 years. Fecal occult blood test (FOBT) of the stool. You may have this test every year starting at age 23. Flexible sigmoidoscopy or colonoscopy. You may have a sigmoidoscopy every 5 years or a colonoscopy every 10 years starting at age 65. Hepatitis C blood test. Hepatitis B blood test. Sexually transmitted disease (STD) testing. Diabetes screening. This is done by checking your blood sugar (glucose) after you have not eaten for a while (fasting). You may have this done every 1-3 years. Bone density scan. This is done to screen for osteoporosis. You may have this done starting at age 22. Mammogram. This may be done every 1-2 years. Talk to your health care provider about how often you should have regular mammograms. Talk with  your health care provider about your test results, treatment options, and if necessary, the need for more tests. Vaccines  Your health care provider may recommend certain vaccines, such as: Influenza vaccine. This is recommended every year. Tetanus, diphtheria, and acellular pertussis (Tdap, Td) vaccine. You may need a Td booster every 10 years. Zoster vaccine. You may need this after  age 35. Pneumococcal 13-valent conjugate (PCV13) vaccine. One dose is recommended after age 18. Pneumococcal polysaccharide (PPSV23) vaccine. One dose is recommended after age 59. Talk to your health care provider about which screenings and vaccines you need and how often you need them. This information is not intended to replace advice given to you by your health care provider. Make sure you discuss any questions you have with your health care provider. Document Released: 05/05/2015 Document Revised: 12/27/2015 Document Reviewed: 02/07/2015 Elsevier Interactive Patient Education  2017 [employer].  Fall Prevention in the Home Falls can cause injuries. They can happen to people of all ages. There are many things you can do to make your home safe and to help prevent falls. What can I do on the outside of my home? Regularly fix the edges of walkways and driveways and fix any cracks. Remove anything that might make you trip as you walk through a door, such as a raised step or threshold. Trim any bushes or trees on the path to your home. Use bright outdoor lighting. Clear any walking paths of anything that might make someone trip, such as rocks or tools. Regularly check to see if handrails are loose or broken. Make sure that both sides of any steps have handrails. Any raised decks and porches should have guardrails on the edges. Have any leaves, snow, or ice cleared regularly. Use sand or salt on walking paths during winter. Clean up any spills in your garage right away. This includes oil or grease spills. What can I do in the bathroom? Use night lights. Install grab bars by the toilet and in the tub and shower. Do not use towel bars as grab bars. Use non-skid mats or decals in the tub or shower. If you need to sit down in the shower, use a plastic, non-slip stool. Keep the floor dry. Clean up any water that spills on the floor as soon as it happens. Remove soap buildup in the tub or shower  regularly. Attach bath mats securely with double-sided non-slip rug tape. Do not have throw rugs and other things on the floor that can make you trip. What can I do in the bedroom? Use night lights. Make sure that you have a light by your bed that is easy to reach. Do not use any sheets or blankets that are too big for your bed. They should not hang down onto the floor. Have a firm chair that has side arms. You can use this for support while you get dressed. Do not have throw rugs and other things on the floor that can make you trip. What can I do in the kitchen? Clean up any spills right away. Avoid walking on wet floors. Keep items that you use a lot in easy-to-reach places. If you need to reach something above you, use a strong step stool that has a grab bar. Keep electrical cords out of the way. Do not use floor polish or wax that makes floors slippery. If you must use wax, use non-skid floor wax. Do not have throw rugs and other things on the floor that can make you trip.  What can I do with my stairs? Do not leave any items on the stairs. Make sure that there are handrails on both sides of the stairs and use them. Fix handrails that are broken or loose. Make sure that handrails are as long as the stairways. Check any carpeting to make sure that it is firmly attached to the stairs. Fix any carpet that is loose or worn. Avoid having throw rugs at the top or bottom of the stairs. If you do have throw rugs, attach them to the floor with carpet tape. Make sure that you have a light switch at the top of the stairs and the bottom of the stairs. If you do not have them, ask someone to add them for you. What else can I do to help prevent falls? Wear shoes that: Do not have high heels. Have rubber bottoms. Are comfortable and fit you well. Are closed at the toe. Do not wear sandals. If you use a stepladder: Make sure that it is fully opened. Do not climb a closed stepladder. Make sure that  both sides of the stepladder are locked into place. Ask someone to hold it for you, if possible. Clearly mark and make sure that you can see: Any grab bars or handrails. First and last steps. Where the edge of each step is. Use tools that help you move around (mobility aids) if they are needed. These include: Canes. Walkers. Scooters. Crutches. Turn on the lights when you go into a dark area. Replace any light bulbs as soon as they burn out. Set up your furniture so you have a clear path. Avoid moving your furniture around. If any of your floors are uneven, fix them. If there are any pets around you, be aware of where they are. Review your medicines with your doctor. Some medicines can make you feel dizzy. This can increase your chance of falling. Ask your doctor what other things that you can do to help prevent falls. This information is not intended to replace advice given to you by your health care provider. Make sure you discuss any questions you have with your health care provider. Document Released: 02/02/2009 Document Revised: 09/14/2015 Document Reviewed: 05/13/2014 Elsevier Interactive Patient Education  2017 [employer].

## 2022-09-23 DIAGNOSIS — M25552 Pain in left hip: Secondary | ICD-10-CM | POA: Diagnosis not present

## 2022-09-23 DIAGNOSIS — M5459 Other low back pain: Secondary | ICD-10-CM | POA: Diagnosis not present

## 2022-10-27 ENCOUNTER — Other Ambulatory Visit: Payer: Self-pay | Admitting: Family Medicine

## 2022-10-27 DIAGNOSIS — J209 Acute bronchitis, unspecified: Secondary | ICD-10-CM

## 2022-10-28 NOTE — Telephone Encounter (Signed)
Requested Prescriptions  Pending Prescriptions Disp Refills   montelukast (SINGULAIR) 10 MG tablet [Pharmacy Med Name: Montelukast Sodium 10 MG Oral Tablet] 90 tablet 0    Sig: TAKE 1 TABLET BY MOUTH AT BEDTIME     Pulmonology:  Leukotriene Inhibitors Passed - 10/27/2022  7:44 AM      Passed - Valid encounter within last 12 months    Recent Outpatient Visits           2 months ago Hypothyroidism, unspecified type   [facility] [place], Marzella Schlein, MD   3 months ago Left hip pain   [facility] Alfredia Ferguson, PA-C   4 months ago Acute left-sided low back pain with left-sided sciatica   [facility] Beryle Flock, Marzella Schlein, MD   8 months ago Annual physical exam   [facility] Merita Norton T, FNP   11 months ago Acute bronchitis with COPD [facility]   [facility] Merita Norton T, FNP               oxybutynin (DITROPAN-XL) 5 MG 24 hr tablet [Pharmacy Med Name: Oxybutynin Chloride ER 5 MG Oral Tablet Extended Release 24 Hour] 90 tablet 0    Sig: TAKE 1 TABLET BY MOUTH AT BEDTIME     Urology:  Bladder Agents Passed - 10/27/2022  7:44 AM      Passed - Valid encounter within last 12 months    Recent Outpatient Visits           2 months ago Hypothyroidism, unspecified type   [facility] [place], Marzella Schlein, MD   3 months ago Left hip pain   [place] [facility] Alfredia Ferguson, PA-C   4 months ago Acute left-sided low back pain with left-sided sciatica   [facility] Beryle Flock, Marzella Schlein, MD   8 months ago Annual physical exam   [facility] Merita Norton T, FNP   11 months ago Acute bronchitis with COPD [facility])   [facility] Jacky Kindle, [place]

## 2022-12-03 ENCOUNTER — Telehealth: Payer: Self-pay

## 2022-12-03 NOTE — Telephone Encounter (Signed)
Printed and mailed

## 2022-12-03 NOTE — Telephone Encounter (Signed)
Copied from CRM [id number]. Topic: General - Inquiry >> Dec 03, 2022 10:50 AM De Blanch wrote: Reason for CRM: Stated needs a copy of after-visit information for her past two office visits mailed. Has a face-to-face meeting on the 22nd with insurance. Stated cannot access MyChart as her hands shake a lot, and after 3 times of entering her password, it kicks her out.  Please advise.

## 2022-12-12 DIAGNOSIS — D6869 Other thrombophilia: Secondary | ICD-10-CM | POA: Diagnosis not present

## 2022-12-22 ENCOUNTER — Other Ambulatory Visit: Payer: Self-pay | Admitting: Cardiovascular Disease

## 2023-01-27 ENCOUNTER — Other Ambulatory Visit: Payer: Self-pay | Admitting: Family Medicine

## 2023-01-27 DIAGNOSIS — J209 Acute bronchitis, unspecified: Secondary | ICD-10-CM

## 2023-01-27 NOTE — Progress Notes (Unsigned)
Cardiology Office Note  Date:  01/28/2023   ID:  Aria Jarrard, DOB [birth year], MRN [id number]  PCP:  Erasmo Downer, MD   Cc: afib   HPI:  [name] is a 82 year old woman with past medical history of Atrial fibrillation, cardioversion 2017 Hyperlipidemia Smoker 3pp week Presenting  for follow-up of her atrial fibrillation  LOV 8/23 In follow-up today she reports having some tightness in the chest, seem to start several weeks ago Does not appreciate tachypalpitations  Lives with daughter, does not drive  At baseline is sedentary No exercise Does groceries and her ADLs  Recent allergies  Rare metoprolol tartrate PRN for elevated heart rate  Smokes 1-2 cigarettes a day, when anxious  Denies lower extreme edema, no PND orthopnea  EKG personally reviewed by myself on todays visit EKG Interpretation Date/Time:  Tuesday January 28 2023 15:25:00 EDT Ventricular Rate:  92 PR Interval:    QRS Duration:  84 QT Interval:  374 QTC Calculation: 462 R Axis:   80  Text Interpretation: Atrial fibrillation No previous ECGs available Confirmed by Julien Nordmann [phone]) on 01/28/2023 3:34:08 PM    Chest pain 6/23, seen in the ER [facility] Could not raise arm, could not pick up coffee cup pain radiating from the left side of the neck to the left shoulder and down the left arm. Pain is constant Cervical radiculopathy Thinks it was anxiety, has not has any more epsiodes  Past medical history reviewed Seen in the hospital in 2017 for atrial fibrillation,  underwent cardioversion, started on Eliquis metoprolol  Echocardiogram 2019, told she had a mild leak of the valve Stress test September 2019   PMH:   has a past medical history of A-fib (HCC), Allergy, Annual physical exam (02/28/2022), Arthritis, Cataract, COPD (chronic obstructive pulmonary disease) (HCC), Hyperlipidemia, Hypothyroidism, and Osteopenia.  PSH:    Past Surgical History:  Procedure Laterality Date    CHOLECYSTECTOMY     VAGINAL HYSTERECTOMY      Current Outpatient Medications  Medication Sig Dispense Refill   apixaban (ELIQUIS) 5 MG TABS tablet Take 1 tablet (5 mg total) by mouth 2 (two) times daily. 180 tablet 3   atorvastatin (LIPITOR) 10 MG tablet TAKE 1 TABLET BY MOUTH ONCE DAILY AT BEDTIME 90 tablet 0   Cholecalciferol 50 MCG (2000 UT) TABS Take by mouth daily.     Cyanocobalamin (B-12 PO) Take by mouth daily in the afternoon.     ELDERBERRY PO Take by mouth daily at 8 pm.     levothyroxine (SYNTHROID) 50 MCG tablet TAKE 1 TABLET BY MOUTH BEFORE BREAKFAST 90 tablet 2   montelukast (SINGULAIR) 10 MG tablet TAKE 1 TABLET BY MOUTH AT BEDTIME 90 tablet 0   oxybutynin (DITROPAN-XL) 10 MG 24 hr tablet Take 1 tablet (10 mg total) by mouth at bedtime. 90 tablet 1   oxybutynin (DITROPAN-XL) 5 MG 24 hr tablet TAKE 1 TABLET BY MOUTH AT BEDTIME 90 tablet 0   metoprolol succinate (TOPROL-XL) 50 MG 24 hr tablet Take 1 tablet (50 mg total) by mouth daily. 90 tablet 3   metoprolol tartrate (LOPRESSOR) 25 MG tablet Take 1 tablet (25 mg total) by mouth 2 (two) times daily as needed. Take for tachycardiac 60 tablet 3   No current facility-administered medications for this visit.    Allergies:   Molds & smuts and Varenicline   Social History:  The patient  reports that she has been smoking cigarettes. She has a 60 pack-year smoking history. She  has never used smokeless tobacco. She reports that she does not currently use alcohol. She reports that she does not use drugs.   Family History:   family history includes Colon cancer in her maternal aunt; Dementia in her mother; Diabetes in her daughter and daughter; Stroke in her maternal grandmother and mother.    Review of Systems: Review of Systems  Constitutional: Negative.   HENT: Negative.    Respiratory: Negative.    Cardiovascular:  Positive for palpitations.  Gastrointestinal: Negative.   Musculoskeletal: Negative.   Neurological:  Negative.   Psychiatric/Behavioral: Negative.    All other systems reviewed and are negative.  PHYSICAL EXAM: VS:  BP 124/60   Pulse 92   Ht 5\' 2"  (1.575 m)   Wt 133 lb (60.3 kg)   SpO2 94%   BMI 24.33 kg/m  , BMI Body mass index is 24.33 kg/m. Constitutional:  oriented to person, place, and time. No distress.  HENT:  Head: Grossly normal Eyes:  no discharge. No scleral icterus.  Neck: No JVD, no carotid bruits  Cardiovascular: Regular rate and rhythm, no murmurs appreciated Pulmonary/Chest: Clear to auscultation bilaterally, no wheezes or rails Abdominal: Soft.  no distension.  no tenderness.  Musculoskeletal: Normal range of motion Neurological:  normal muscle tone. Coordination normal. No atrophy Skin: Skin warm and dry Psychiatric: normal affect, pleasant  Recent Labs: 02/28/2022: ALT 26; BUN 9; Creatinine, Ser 0.88; Hemoglobin 13.2; Platelets 222; Potassium 4.9; Sodium 140 08/29/2022: TSH 1.390    Lipid Panel Lab Results  Component Value Date   CHOL 130 02/28/2022   HDL 58 02/28/2022   LDLCALC 52 02/28/2022   TRIG 112 02/28/2022    Wt Readings from Last 3 Encounters:  01/28/23 133 lb (60.3 kg)  09/11/22 134 lb (60.8 kg)  08/29/22 134 lb 14.4 oz (61.2 kg)     ASSESSMENT AND PLAN:  Problem List Items Addressed This Visit       Cardiology Problems   Hyperlipidemia   Relevant Medications   metoprolol tartrate (LOPRESSOR) 25 MG tablet   metoprolol succinate (TOPROL-XL) 50 MG 24 hr tablet   Other Relevant Orders   EKG 12-Lead (Completed)   Paroxysmal atrial fibrillation (HCC) - Primary   Relevant Medications   metoprolol tartrate (LOPRESSOR) 25 MG tablet   metoprolol succinate (TOPROL-XL) 50 MG 24 hr tablet   Other Relevant Orders   EKG 12-Lead (Completed)   Other Visit Diagnoses     Smoker       Relevant Orders   EKG 12-Lead (Completed)   Chest tightness       Relevant Orders   EKG 12-Lead (Completed)   Chronic anticoagulation       Relevant  Orders   EKG 12-Lead (Completed)       Paroxysmal atrial fibrillation On Eliquis,  atrial fibrillation on EKG,  asymptomatic Mildly elevated heart rate recommend she increase metoprolol succinate up to 50 daily Metoprolol tartrate 25 twice daily PRN for symptomatic palpitations Discussed various treatment options with her, she prefers rate control, declining cardioversion  Smoker We have encouraged her to continue to work on weaning her cigarettes and smoking cessation. She will continue to work on this and does not want any assistance with chantix.    Hyperlipidemia Cholesterol is at goal on the current lipid regimen. No changes to the medications were made.  Cardiac risk factors Smoking cessation recommended,  Cholesterol well-controlled   Total encounter time more than 30 minutes Greater than 50% was spent in counseling and  coordination of care with the patient   Signed, Dossie Arbour, M.D., Ph.D. [facility] [place], [place] [phone]

## 2023-01-27 NOTE — Telephone Encounter (Signed)
Requested Prescriptions  Pending Prescriptions Disp Refills   montelukast (SINGULAIR) 10 MG tablet [Pharmacy Med Name: Montelukast Sodium 10 MG Oral Tablet] 90 tablet 0    Sig: TAKE 1 TABLET BY MOUTH AT BEDTIME     Pulmonology:  Leukotriene Inhibitors Passed - 01/27/2023  6:44 AM      Passed - Valid encounter within last 12 months    Recent Outpatient Visits           5 months ago Hypothyroidism, unspecified type   [facility] Louise, Marzella Schlein, MD   6 months ago Left hip pain   [facility] Alfredia Ferguson, PA-C   7 months ago Acute left-sided low back pain with left-sided sciatica   [facility] Flock, Marzella Schlein, MD   11 months ago Annual physical exam   [facility] Merita Norton T, FNP   1 year ago Acute bronchitis with COPD [facility])   [facility] Jacky Kindle, FNP       Future Appointments             Tomorrow Mariah Milling, Tollie Pizza, MD [facility]             oxybutynin (DITROPAN-XL) 5 MG 24 hr tablet [Pharmacy Med Name: Oxybutynin Chloride ER 5 MG Oral Tablet Extended Release 24 Hour] 90 tablet 0    Sig: TAKE 1 TABLET BY MOUTH AT BEDTIME     Urology:  Bladder Agents Passed - 01/27/2023  6:44 AM      Passed - Valid encounter within last 12 months    Recent Outpatient Visits           5 months ago Hypothyroidism, unspecified type   [facility], Marzella Schlein, MD   6 months ago Left hip pain   [facility] Alfredia Ferguson, PA-C   7 months ago Acute left-sided low back pain with left-sided sciatica   [facility] Beryle Flock, Marzella Schlein, MD   11 months ago Annual physical exam   [facility] Merita Norton T, FNP   1 year ago Acute bronchitis with COPD [facility])   [facility] Jacky Kindle, FNP       Future Appointments             Tomorrow Gollan, Tollie Pizza, MD [place] [facility]

## 2023-01-28 ENCOUNTER — Ambulatory Visit: Payer: Medicare Other | Attending: Cardiovascular Disease | Admitting: Cardiovascular Disease

## 2023-01-28 ENCOUNTER — Encounter: Payer: Self-pay | Admitting: Cardiovascular Disease

## 2023-01-28 VITALS — BP 124/60 | HR 92 | Ht 62.0 in | Wt 133.0 lb

## 2023-01-28 DIAGNOSIS — R0789 Other chest pain: Secondary | ICD-10-CM | POA: Diagnosis not present

## 2023-01-28 DIAGNOSIS — Z7901 Long term (current) use of anticoagulants: Secondary | ICD-10-CM

## 2023-01-28 DIAGNOSIS — F17200 Nicotine dependence, unspecified, uncomplicated: Secondary | ICD-10-CM | POA: Diagnosis not present

## 2023-01-28 DIAGNOSIS — E782 Mixed hyperlipidemia: Secondary | ICD-10-CM | POA: Diagnosis not present

## 2023-01-28 DIAGNOSIS — I480 Paroxysmal atrial fibrillation: Secondary | ICD-10-CM | POA: Diagnosis not present

## 2023-01-28 MED ORDER — METOPROLOL TARTRATE 25 MG PO TABS: 25.0000 mg | ORAL_TABLET | Freq: Two times a day (BID) | ORAL | 3 refills | Status: AC | PRN

## 2023-01-28 MED ORDER — METOPROLOL SUCCINATE ER 50 MG PO TB24: 50.0000 mg | ORAL_TABLET | Freq: Every day | ORAL | 3 refills | Status: DC

## 2023-01-28 NOTE — Patient Instructions (Addendum)
Medication Instructions:  Please increase the metoprolol succinate up to 50 mg daily  Take metoprolol tartrate as needed for fast heart rate >100  If you need a refill on your cardiac medications before your next appointment, please call your pharmacy.   Lab work: No new labs needed  Testing/Procedures: No new testing needed  Follow-Up: At [facility], you and your health needs are our priority.  As part of our continuing mission to provide you with exceptional heart care, we have created designated Provider Care Teams.  These Care Teams include your primary Cardiologist (physician) and Advanced Practice Providers (APPs -  Physician Assistants and Nurse Practitioners) who all work together to provide you with the care you need, when you need it.  You will need a follow up appointment in 6 months  Providers on your designated Care Team:   Nicolasa Ducking, NP Eula Listen, PA-C Cadence Fransico Michael, [place]  COVID-19 Vaccine Information can be found at: [email or web address] For questions related to vaccine distribution or appointments, please email vaccine@[email or web address] .[email or web address] or call [phone].

## 2023-03-08 ENCOUNTER — Other Ambulatory Visit: Payer: Self-pay | Admitting: Cardiovascular Disease

## 2023-03-12 ENCOUNTER — Other Ambulatory Visit: Payer: Self-pay | Admitting: Family Medicine

## 2023-03-12 ENCOUNTER — Other Ambulatory Visit: Payer: Self-pay | Admitting: Cardiovascular Disease

## 2023-03-12 NOTE — Telephone Encounter (Signed)
Prescription refill request for Eliquis received. Indication:afib Last office visit:10/24 Scr:0.88 2023 Age: 82 Weight:60.3  kg  Prescription refilled

## 2023-03-12 NOTE — Telephone Encounter (Signed)
Medication Refill -  Most Recent Primary Care Visit:  Provider: Sue Lush  Department: [facility]  Visit Type: MEDICARE AWV, SEQUENTIAL  Date: 09/11/2022  Medication: apixaban (ELIQUIS) 5 MG TABS tablet   Has the patient contacted their pharmacy? Yes (Agent: If no, request that the patient contact the pharmacy for the refill. If patient does not wish to contact the pharmacy document the reason why and proceed with request.) (Agent: If yes, when and what did the pharmacy advise?)  Is this the correct pharmacy for this prescription? Yes If no, delete pharmacy and type the correct one.  This is the patient's preferred pharmacy:  [facility] [address], [place] - [address] [place] [address] Phone: [phone] Fax: [phone]   Has the prescription been filled recently? No  Is the patient out of the medication? No  Has the patient been seen for an appointment in the last year OR does the patient have an upcoming appointment? Yes  Can we respond through MyChart? No Pt prefers to be contacted by phone  Agent: Please be advised that Rx refills may take up to 3 business days. We ask that you follow-up with your pharmacy.

## 2023-03-12 NOTE — Telephone Encounter (Signed)
Refill Request.  

## 2023-03-13 NOTE — Telephone Encounter (Signed)
Requested Prescriptions  Refused Prescriptions Disp Refills   apixaban (ELIQUIS) 5 MG TABS tablet 180 tablet 1    Sig: Take 1 tablet (5 mg total) by mouth 2 (two) times daily.     Hematology:  Anticoagulants - apixaban Failed - 03/12/2023  5:48 PM      Failed - PLT in normal range and within 360 days    Platelets  Date Value Ref Range Status  02/28/2022 222 150 - 450 x10E3/uL Final         Failed - HGB in normal range and within 360 days    Hemoglobin  Date Value Ref Range Status  02/28/2022 13.2 11.1 - 15.9 g/dL Final         Failed - HCT in normal range and within 360 days    Hematocrit  Date Value Ref Range Status  02/28/2022 40.9 34.0 - 46.6 % Final         Failed - Cr in normal range and within 360 days    Creatinine, Ser  Date Value Ref Range Status  02/28/2022 0.88 0.57 - 1.00 mg/dL Final         Failed - AST in normal range and within 360 days    AST  Date Value Ref Range Status  02/28/2022 26 0 - 40 IU/L Final         Failed - ALT in normal range and within 360 days    ALT  Date Value Ref Range Status  02/28/2022 26 0 - 32 IU/L Final         Passed - Valid encounter within last 12 months    Recent Outpatient Visits           6 months ago Hypothyroidism, unspecified type   [facility] Erasmo Downer, MD   8 months ago Left hip pain   [facility] Alfredia Ferguson, PA-C   8 months ago Acute left-sided low back pain with left-sided sciatica   [facility]ery[facility] Flock, Marzella Schlein, MD   1 year ago Annual physical exam   [facility] Merita Norton T, FNP   1 year ago Acute bronchitis with COPD [facility])   [facility] Jacky Kindle, FNP       Future Appointments             In 2 weeks Bacigalupo, Marzella Schlein, MD [facility] [place]

## 2023-03-29 ENCOUNTER — Other Ambulatory Visit: Payer: Self-pay | Admitting: Cardiovascular Disease

## 2023-04-01 ENCOUNTER — Ambulatory Visit (INDEPENDENT_AMBULATORY_CARE_PROVIDER_SITE_OTHER): Payer: Medicare Other | Admitting: Family Medicine

## 2023-04-01 ENCOUNTER — Encounter: Payer: Self-pay | Admitting: Family Medicine

## 2023-04-01 VITALS — BP 126/75 | HR 70 | Resp 16 | Ht 62.0 in | Wt 138.4 lb

## 2023-04-01 DIAGNOSIS — I480 Paroxysmal atrial fibrillation: Secondary | ICD-10-CM

## 2023-04-01 DIAGNOSIS — E782 Mixed hyperlipidemia: Secondary | ICD-10-CM

## 2023-04-01 DIAGNOSIS — E559 Vitamin D deficiency, unspecified: Secondary | ICD-10-CM | POA: Diagnosis not present

## 2023-04-01 DIAGNOSIS — Z23 Encounter for immunization: Secondary | ICD-10-CM | POA: Diagnosis not present

## 2023-04-01 DIAGNOSIS — D6869 Other thrombophilia: Secondary | ICD-10-CM | POA: Diagnosis not present

## 2023-04-01 DIAGNOSIS — G4700 Insomnia, unspecified: Secondary | ICD-10-CM

## 2023-04-01 DIAGNOSIS — M8580 Other specified disorders of bone density and structure, unspecified site: Secondary | ICD-10-CM | POA: Diagnosis not present

## 2023-04-01 DIAGNOSIS — Z0000 Encounter for general adult medical examination without abnormal findings: Secondary | ICD-10-CM | POA: Diagnosis not present

## 2023-04-01 DIAGNOSIS — E039 Hypothyroidism, unspecified: Secondary | ICD-10-CM | POA: Diagnosis not present

## 2023-04-01 DIAGNOSIS — R739 Hyperglycemia, unspecified: Secondary | ICD-10-CM | POA: Diagnosis not present

## 2023-04-01 NOTE — Progress Notes (Unsigned)
Complete physical exam  Patient: Emma Villarreal   DOB: 04-16-1941   82 y.o. Female  MRN: [id number]  Subjective:    Chief Complaint  Patient presents with  . Annual Exam    Emma Villarreal is a 82 y.o. female who presents today for a complete physical exam. She reports consuming a general diet.  She generally feels well. She reports sleeping fairly well. She does have additional problems to discuss today.    Discussed the use of AI scribe software for clinical note transcription with the patient, who gave verbal consent to proceed.  History of Present Illness           Most recent fall risk assessment:    04/01/2023    2:26 PM  Fall Risk   Falls in the past year? 0  Number falls in past yr: 0  Injury with Fall? 0  Risk for fall due to : No Fall Risks     Most recent depression screenings:    04/01/2023    2:26 PM 09/11/2022    1:49 PM  PHQ 2/9 Scores  PHQ - 2 Score 1 0  PHQ- 9 Score 6     {VISON DENTAL STD PSA (Optional):27386}  {History (Optional):23778}  Patient Care Team: Erasmo Downer, MD as PCP - General (Family Medicine) Mariah Milling, Tollie Pizza, MD as PCP - Cardiology (Cardiology) Antonieta Iba, MD as Consulting Physician (Cardiology) Pa, [facility] [address] [facility])   Outpatient Medications Prior to Visit  Medication Sig  . apixaban (ELIQUIS) 5 MG TABS tablet Take 1 tablet by mouth twice daily  . atorvastatin (LIPITOR) 10 MG tablet TAKE 1 TABLET BY MOUTH ONCE DAILY AT BEDTIME  . Cholecalciferol 50 MCG (2000 UT) TABS Take by mouth daily.  . Cyanocobalamin (B-12 PO) Take by mouth daily in the afternoon.  Marland Kitchen ELDERBERRY PO Take by mouth daily at 8 pm.  . levothyroxine (SYNTHROID) 50 MCG tablet TAKE 1 TABLET BY MOUTH BEFORE BREAKFAST  . metoprolol succinate (TOPROL-XL) 50 MG 24 hr tablet Take 1 tablet (50 mg total) by mouth daily.  . metoprolol tartrate (LOPRESSOR) 25 MG tablet Take 1 tablet (25 mg total) by mouth 2 (two) times daily as needed. Take for  tachycardiac  . montelukast (SINGULAIR) 10 MG tablet TAKE 1 TABLET BY MOUTH AT BEDTIME  . oxybutynin (DITROPAN-XL) 10 MG 24 hr tablet Take 1 tablet (10 mg total) by mouth at bedtime.  Marland Kitchen oxybutynin (DITROPAN-XL) 5 MG 24 hr tablet TAKE 1 TABLET BY MOUTH AT BEDTIME   No facility-administered medications prior to visit.    ROS        Objective:     BP 126/75 (BP Location: Left Arm, Patient Position: Sitting, Cuff Size: Normal)   Pulse 70   Resp 16   Ht 5\' 2"  (1.575 m)   Wt 138 lb 6.4 oz (62.8 kg)   BMI 25.31 kg/m  {Vitals History (Optional):23777}  Physical Exam Vitals reviewed.  Constitutional:      General: She is not in acute distress.    Appearance: Normal appearance. She is well-developed. She is not diaphoretic.  HENT:     Head: Normocephalic and atraumatic.     Right Ear: Tympanic membrane, ear canal and external ear normal.     Left Ear: Tympanic membrane, ear canal and external ear normal.     Nose: Nose normal.     Mouth/Throat:     Mouth: Mucous membranes are moist.     Pharynx: Oropharynx is  clear. No oropharyngeal exudate.  Eyes:     General: No scleral icterus.    Conjunctiva/sclera: Conjunctivae normal.     Pupils: Pupils are equal, round, and reactive to light.  Neck:     Thyroid: No thyromegaly.  Cardiovascular:     Rate and Rhythm: Normal rate and regular rhythm.     Heart sounds: Normal heart sounds. No murmur heard. Pulmonary:     Effort: Pulmonary effort is normal. No respiratory distress.     Breath sounds: Normal breath sounds. No wheezing or rales.  Abdominal:     General: There is no distension.     Palpations: Abdomen is soft.     Tenderness: There is no abdominal tenderness.  Musculoskeletal:        General: No deformity.     Cervical back: Neck supple.     Right lower leg: No edema.     Left lower leg: No edema.  Lymphadenopathy:     Cervical: No cervical adenopathy.  Skin:    General: Skin is warm and dry.     Findings: No rash.   Neurological:     Mental Status: She is alert and oriented to person, place, and time. Mental status is at baseline.     Gait: Gait normal.  Psychiatric:        Mood and Affect: Mood normal.        Behavior: Behavior normal.        Thought Content: Thought content normal.     No results found for any visits on 04/01/23. {Show previous labs (optional):23779}    Assessment & Plan:    Routine Health Maintenance and Physical Exam  Immunization History  Administered Date(s) Administered  . Fluad Quad(high Dose 65+) 02/28/2022  . Influenza, High Dose Seasonal PF 02/09/2013, 06/12/2015, 02/02/2016, 01/21/2019, 05/02/2020  . PFIZER(Purple Top)SARS-COV-2 Vaccination 05/17/2019, 06/07/2019, 02/26/2020  . PNEUMOCOCCAL CONJUGATE-20 11/07/2020  . Pneumococcal Polysaccharide-23 08/02/2009  . Zoster, Live 08/02/2009    Health Maintenance  Topic Date Due  . DTaP/Tdap/Td (1 - Tdap) Never done  . Zoster Vaccines- Shingrix (1 of 2) 12/16/1990  . INFLUENZA VACCINE  11/21/2022  . COVID-19 Vaccine (4 - 2023-24 season) 12/22/2022  . DEXA SCAN  07/21/2023  . Medicare Annual Wellness (AWV)  09/11/2023  . Pneumonia Vaccine 76+ Years old  Completed  . HPV VACCINES  Aged Out    Discussed health benefits of physical activity, and encouraged her to engage in regular exercise appropriate for her age and condition.  Problem List Items Addressed This Visit   None Visit Diagnoses     Immunization due    -  Primary   Relevant Orders   Flu Vaccine Trivalent High Dose (Fluad)                   No follow-ups on file.     Shirlee Latch, MD

## 2023-04-01 NOTE — Patient Instructions (Signed)
Consider asking at the pharmacy about the tetanus shot (TDAP) and the shingles shot (Shingrix)  ?

## 2023-04-02 ENCOUNTER — Telehealth: Payer: Self-pay

## 2023-04-02 LAB — COMPREHENSIVE METABOLIC PANEL
ALT: 38 [IU]/L — ABNORMAL HIGH (ref 0–32)
AST: 34 [IU]/L (ref 0–40)
Albumin: 4.6 g/dL (ref 3.7–4.7)
Alkaline Phosphatase: 78 [IU]/L (ref 44–121)
BUN/Creatinine Ratio: 12 (ref 12–28)
BUN: 10 mg/dL (ref 8–27)
Bilirubin Total: 0.8 mg/dL (ref 0.0–1.2)
CO2: 27 mmol/L (ref 20–29)
Calcium: 9.7 mg/dL (ref 8.7–10.3)
Chloride: 99 mmol/L (ref 96–106)
Creatinine, Ser: 0.82 mg/dL (ref 0.57–1.00)
Globulin, Total: 2.8 g/dL (ref 1.5–4.5)
Glucose: 88 mg/dL (ref 70–99)
Potassium: 4.9 mmol/L (ref 3.5–5.2)
Sodium: 139 mmol/L (ref 134–144)
Total Protein: 7.4 g/dL (ref 6.0–8.5)
eGFR: 71 mL/min/{1.73_m2} (ref >59)

## 2023-04-02 LAB — CBC WITH DIFFERENTIAL/PLATELET
Basophils Absolute: 0 10*3/uL (ref 0.0–0.2)
Basos: 0 %
EOS (ABSOLUTE): 0.2 10*3/uL (ref 0.0–0.4)
Eos: 3 %
Hematocrit: 41.5 % (ref 34.0–46.6)
Hemoglobin: 13.4 g/dL (ref 11.1–15.9)
Immature Grans (Abs): 0 10*3/uL (ref 0.0–0.1)
Immature Granulocytes: 0 %
Lymphocytes Absolute: 2.1 10*3/uL (ref 0.7–3.1)
Lymphs: 28 %
MCH: 30.7 pg (ref 26.6–33.0)
MCHC: 32.3 g/dL (ref 31.5–35.7)
MCV: 95 fL (ref 79–97)
Monocytes Absolute: 0.7 10*3/uL (ref 0.1–0.9)
Monocytes: 9 %
Neutrophils Absolute: 4.4 10*3/uL (ref 1.4–7.0)
Neutrophils: 60 %
Platelets: 219 10*3/uL (ref 150–450)
RBC: 4.37 x10E6/uL (ref 3.77–5.28)
RDW: 13 % (ref 11.7–15.4)
WBC: 7.4 10*3/uL (ref 3.4–10.8)

## 2023-04-02 LAB — LIPID PANEL
Chol/HDL Ratio: 2 {ratio} (ref 0.0–4.4)
Cholesterol, Total: 99 mg/dL — ABNORMAL LOW (ref 100–199)
HDL: 49 mg/dL (ref >39)
LDL Chol Calc (NIH): 31 mg/dL (ref 0–99)
Triglycerides: 103 mg/dL (ref 0–149)
VLDL Cholesterol Cal: 19 mg/dL (ref 5–40)

## 2023-04-02 LAB — VITAMIN D 25 HYDROXY (VIT D DEFICIENCY, FRACTURES): Vit D, 25-Hydroxy: 35.9 ng/mL (ref 30.0–100.0)

## 2023-04-02 LAB — HEMOGLOBIN A1C
Est. average glucose Bld gHb Est-mCnc: 126 mg/dL
Hgb A1c MFr Bld: 6 % — ABNORMAL HIGH (ref 4.8–5.6)

## 2023-04-02 LAB — TSH: TSH: 2.56 u[IU]/mL (ref 0.450–4.500)

## 2023-04-02 NOTE — Assessment & Plan Note (Signed)
Chronic atrial fibrillation managed with Eliquis 5 mg BID, metoprolol 50 mg daily, and additional 25 mg PRN. Patient reports bruising and concerns about medication. Discussed the importance of stroke prevention with Eliquis and the use of compression socks to manage leg swelling and bruising. F/b Cardiology - Continue Eliquis 5 mg BID - Continue metoprolol 50 mg daily and 25 mg PRN - Advise on the use of compression socks

## 2023-04-02 NOTE — Assessment & Plan Note (Signed)
Previously well controlled Continue Synthroid at current dose  Recheck TSH and adjust Synthroid as indicated   

## 2023-04-02 NOTE — Assessment & Plan Note (Signed)
Previously well controlled Continue statin Repeat FLP and CMP  

## 2023-04-02 NOTE — Telephone Encounter (Signed)
Pt given lab results per notes of Dr. Leonard Schwartz on 04/02/23. Pt verbalized understanding. Pt states that her weakness is sweets but will try to do better.   Normal/stable labs, except Hemoglobin A1c, 3 month avg of blood sugars, is in prediabetic range.  In order to prevent progression to diabetes, recommend low carb diet and regular exercise  Written by Erasmo Downer, MD on 04/02/2023 12:52 PM EST

## 2023-04-02 NOTE — Assessment & Plan Note (Signed)
From A. fib Continue Eliquis Recheck CBC

## 2023-04-02 NOTE — Assessment & Plan Note (Signed)
Osteopenia, well-managed. Due for follow-up bone density scan in March. - Order bone density scan for March

## 2023-04-23 ENCOUNTER — Other Ambulatory Visit: Payer: Self-pay | Admitting: Family Medicine

## 2023-04-23 DIAGNOSIS — J209 Acute bronchitis, unspecified: Secondary | ICD-10-CM

## 2023-05-22 ENCOUNTER — Telehealth: Payer: Self-pay | Admitting: Family Medicine

## 2023-05-22 NOTE — Telephone Encounter (Signed)
Pt has not heard from anyone about scheduling bone density test. Pt requesting assistance with scheduling appointment.   Please assist pt further

## 2023-05-23 NOTE — Telephone Encounter (Signed)
Number provided to schedule imaging  [phone]

## 2023-05-23 NOTE — Telephone Encounter (Signed)
 Detailed VM left per DPR. CRM created. Ok for [facility] to advise/clarify if patient returns call

## 2023-06-10 ENCOUNTER — Ambulatory Visit
Admission: RE | Admit: 2023-06-10 | Discharge: 2023-06-10 | Disposition: A | Discharge status: Home / Self Care | Payer: Medicare Other | Source: Ambulatory Visit | Attending: Family Medicine | Admitting: Family Medicine

## 2023-06-10 DIAGNOSIS — E0590 Thyrotoxicosis, unspecified without thyrotoxic crisis or storm: Secondary | ICD-10-CM | POA: Diagnosis not present

## 2023-06-10 DIAGNOSIS — F17200 Nicotine dependence, unspecified, uncomplicated: Secondary | ICD-10-CM | POA: Diagnosis not present

## 2023-06-10 DIAGNOSIS — Z780 Asymptomatic menopausal state: Secondary | ICD-10-CM | POA: Insufficient documentation

## 2023-06-10 DIAGNOSIS — M8580 Other specified disorders of bone density and structure, unspecified site: Secondary | ICD-10-CM | POA: Insufficient documentation

## 2023-06-10 DIAGNOSIS — M85851 Other specified disorders of bone density and structure, right thigh: Secondary | ICD-10-CM | POA: Diagnosis not present

## 2023-06-10 DIAGNOSIS — Z13820 Encounter for screening for osteoporosis: Secondary | ICD-10-CM | POA: Diagnosis not present

## 2023-06-10 DIAGNOSIS — M8589 Other specified disorders of bone density and structure, multiple sites: Secondary | ICD-10-CM | POA: Insufficient documentation

## 2023-06-12 ENCOUNTER — Encounter: Payer: Self-pay | Admitting: Family Medicine

## 2023-06-17 NOTE — Telephone Encounter (Signed)
 Left voicemail for patient to call back for bone density results.

## 2023-06-18 ENCOUNTER — Telehealth: Payer: Self-pay | Admitting: Family Medicine

## 2023-06-18 NOTE — Telephone Encounter (Signed)
 Patient called once again for the results of bone density test.

## 2023-06-18 NOTE — Telephone Encounter (Signed)
 Patient would like a return call about her bone density test.

## 2023-06-19 NOTE — Telephone Encounter (Signed)
 Erasmo Downer, MD 06/12/2023  1:24 PM EST     Bone density scan shows osteopenia (this is some bone loss, but not as bad as osteoporosis).  Recommend regular weight bearing exercise, avoiding smoking, and adequate Ca (1200mg /day) and Vit D (1000 units daily) via diet or supplement.  We will recheck in 2 years to ensure this hasn't worsened.

## 2023-06-19 NOTE — Telephone Encounter (Signed)
 Detalied voicemail left per DPR and advised results available through [employer]

## 2023-07-01 NOTE — Progress Notes (Signed)
 Attempted to contact the pt in regards to her Bone Density results per Dr B, but was only able to leave a voice message asking her to contact the office back regarding this matter

## 2023-07-01 NOTE — Telephone Encounter (Signed)
 Copied from CRM [id number]. Topic: Clinical - Lab/Test Results >> Jul 01, 2023 10:18 AM Izetta Dakin wrote: Reason for CRM: Requesting results to bone density test.

## 2023-07-01 NOTE — Telephone Encounter (Signed)
 Left detailed VM per DPR Ok to advise if patient returns call

## 2023-07-23 ENCOUNTER — Other Ambulatory Visit: Payer: Self-pay | Admitting: Family Medicine

## 2023-07-23 DIAGNOSIS — J209 Acute bronchitis, unspecified: Secondary | ICD-10-CM

## 2023-07-24 NOTE — Telephone Encounter (Signed)
 OV 04/01/23 Requested Prescriptions  Pending Prescriptions Disp Refills   levothyroxine (SYNTHROID) 50 MCG tablet [Pharmacy Med Name: Levothyroxine Sodium 50 MCG Oral Tablet] 90 tablet 0    Sig: TAKE 1 TABLET BY MOUTH BEFORE BREAKFAST     Endocrinology:  Hypothyroid Agents Failed - 07/24/2023  2:23 PM      Failed - Valid encounter within last 12 months    Recent Outpatient Visits   None     Future Appointments             In 1 week Gollan, Tollie Pizza, MD [facility] - TSH in normal range and within 360 days    TSH  Date Value Ref Range Status  04/01/2023 2.560 0.450 - 4.500 uIU/mL Final          montelukast (SINGULAIR) 10 MG tablet [Pharmacy Med Name: Montelukast Sodium 10 MG Oral Tablet] 90 tablet 0    Sig: TAKE 1 TABLET BY MOUTH AT BEDTIME     Pulmonology:  Leukotriene Inhibitors Failed - 07/24/2023  2:23 PM      Failed - Valid encounter within last 12 months    Recent Outpatient Visits   None     Future Appointments             In 1 week Gollan, Tollie Pizza, MD [facility]

## 2023-08-05 ENCOUNTER — Ambulatory Visit: Payer: Medicare Other | Admitting: Cardiovascular Disease

## 2023-08-09 NOTE — Progress Notes (Unsigned)
 Cardiology Clinic Note   Date: 08/11/2023 ID: Emma Villarreal, DOB [birth year], MRN [id number]  Primary Cardiologist:  Belva Boyden, MD  Chief Complaint   Emma Villarreal is a 83 y.o. female who presents to the clinic today for routine follow up.   Patient Profile   Emma Villarreal is followed by Dr. Gollan for the history outlined below.      Past medical history significant for: PAF. Onset 2017. Cardioversion 2017. Hyperlipidemia. Lipid panel 04/01/2023: LDL 31, HDL 49, TG 103, total 99. Hypothyroidism. Tobacco abuse.  In summary, patient was previously followed by [facility] cardiology.  She established care with Dr. Gollan on 06/06/2020.  She was seen in the hospital in 2017 and noted to be in A-fib.  She underwent cardioversion and was started on Eliquis  and metoprolol .  Echo at that time demonstrated EF 60 to 65%, no RWMA, moderate concentric LVH, moderate to severe pulmonary hypertension, mild MR, moderate to severe TR, moderate BAE.  She had repeat echo in September 2020 which demonstrated normal LV function, 2+ TR, 1+ AR/MR.  Stress testing was negative for ischemia.  At the time of her visit with Dr. Gollan it was unclear if she was having episodes of PAF.  She was instructed to take an extra dose of metoprolol  for sustained elevated heart rate.  In August 2022 metoprolol  tartrate was changed to metoprolol  succinate with instructions to take metoprolol  tartrate as needed.  Patient was last seen in the office by Dr. Gollan on 01/28/2023 for routine follow-up.  She was noted to be in A-fib with heart rate 92 bpm.  It was recommended she increase Toprol  to 50 mg daily.  She declined cardioversion.  She was working on quitting smoking.    History of Present Illness    Today, patient is here alone. She reports occasional palpitations described as heart racing typically in the setting of increased stress or emotional upset. Last episode was three weeks ago when she discovered fraudulent  activity on her bank account. She took prn metoprolol  tartrate and episode resolved. She reports stable chronic DOE secondary to smoking history. She reports occasional central chest pain described as tightness which is sometimes associated with feeling her heart race. It will resolve when she take prn metoprolol  or when she lays down to rest. She does not get much exercise. She will occasionally do exercises she learned in physical therapy. She likes to walk but this winter made it very difficult for her to do so. She does walk her dog a short distance a few times a day. She continues to smoke about a pack a week.     ROS: All other systems reviewed and are otherwise negative except as noted in History of Present Illness.  EKGs/Labs Reviewed    EKG Interpretation Date/Time:  Monday August 11 2023 15:35:17 EDT Ventricular Rate:  86 PR Interval:    QRS Duration:  86 QT Interval:  384 QTC Calculation: 459 R Axis:   87  Text Interpretation: Atrial fibrillation Nonspecific T wave abnormality When compared with ECG of 28-Jan-2023 15:25, No significant change Confirmed by Morey Ar [phone]) on 08/11/2023 3:42:48 PM   04/01/2023: ALT 38; AST 34; BUN 10; Creatinine, Ser 0.82; Potassium 4.9; Sodium 139   04/01/2023: Hemoglobin 13.4; WBC 7.4   04/01/2023: TSH 2.560    Risk Assessment/Calculations     CHA2DS2-VASc Score = 3   This indicates a 3.2% annual risk of stroke. The patient's score is based upon: CHF History: 0 HTN  History: 0 Diabetes History: 0 Stroke History: 0 Vascular Disease History: 0 Age Score: 2 Gender Score: 1             Physical Exam    VS:  BP 132/68   Pulse 86   Ht 5\' 2"  (1.575 m)   Wt 128 lb 3.2 oz (58.2 kg)   SpO2 92%   BMI 23.45 kg/m  , BMI Body mass index is 23.45 kg/m.  GEN: Well nourished, well developed, in no acute distress. Neck: No JVD or carotid bruits. Cardiac: Irregular rhythm. No murmurs. No rubs or gallops.   Respiratory:   Respirations regular and unlabored. Clear to auscultation without rales, wheezing or rhonchi. GI: Soft, nontender, nondistended. Extremities: Radials/DP/PT 2+ and equal bilaterally. No clubbing or cyanosis. No edema.  Skin: Warm and dry, no rash. Neuro: Strength intact.  Assessment & Plan   PAF Onset 2017 with cardioversion performed at [facility].  Patient was in A-fib with HR 92 bpm at last visit October 2024.  Denies spontaneous bleeding concerns.  She reports occasional episodes of feeling as though her heart is racing particularly when she is under increased stress or emotionally upset. Last episode was 3 weeks ago when there was fraudulent activity on her bank account. She took as needed metoprolol  and it resolved. She otherwise has no cardiac awareness of afib. EKG demonstrate afib 86 bpm.  - Continue Toprol , Lopressor  as needed, Eliquis .   Chest pain Patient reports occasional central chest tightness occurring with feeling of heart racing or during stress and emotional upset. When associated with heart racing she will take as needed metoprolol . Pain will also resolve with rest. Patient also reports sleeping a lot. She will fall asleep when she is watching TV and sleep for hours then go to bed and sleep through the night. Discussed further evaluation with cardiac PET stress. She is not interested in pursing this. She would like to start a walking routine with her grandson and see if things improve.  -Patient declines stress testing at this time.  -Instructed to contact the office if chest pain episodes increase in frequency or become more persistent.   Hyperlipidemia LDL 22 April 2023, at goal. - Continue atorvastatin .  Tobacco abuse Patient continues to smoke a pack a week.  -Smoking cessation encouraged.   Disposition: Return in 6 months or sooner as needed.          Signed, Lonell Rives. Kamrin Spath, DNP, NP-C

## 2023-08-11 ENCOUNTER — Ambulatory Visit: Attending: Student | Admitting: Student

## 2023-08-11 ENCOUNTER — Encounter: Payer: Self-pay | Admitting: Student

## 2023-08-11 VITALS — BP 132/68 | HR 86 | Ht 62.0 in | Wt 128.2 lb

## 2023-08-11 DIAGNOSIS — R079 Chest pain, unspecified: Secondary | ICD-10-CM

## 2023-08-11 DIAGNOSIS — Z720 Tobacco use: Secondary | ICD-10-CM

## 2023-08-11 DIAGNOSIS — E785 Hyperlipidemia, unspecified: Secondary | ICD-10-CM

## 2023-08-11 DIAGNOSIS — I480 Paroxysmal atrial fibrillation: Secondary | ICD-10-CM | POA: Diagnosis not present

## 2023-08-11 NOTE — Patient Instructions (Signed)
 Medication Instructions:  Your Physician recommend you continue on your current medication as directed.    *If you need a refill on your cardiac medications before your next appointment, please call your pharmacy*  Follow-Up: At [facility], you and your health needs are our priority.  As part of our continuing mission to provide you with exceptional heart care, our providers are all part of one team.  This team includes your primary Cardiologist (physician) and Advanced Practice Providers or APPs (Physician Assistants and Nurse Practitioners) who all work together to provide you with the care you need, when you need it.  Your next appointment:   6 month(s)  Provider:   Timothy Gollan, MD or Morey Ar, NP

## 2023-08-13 ENCOUNTER — Other Ambulatory Visit: Payer: Self-pay | Admitting: Family Medicine

## 2023-08-13 NOTE — Telephone Encounter (Signed)
 Copied from CRM [id number]. Topic: Clinical - Medication Refill >> Aug 13, 2023  1:52 PM Carlatta H wrote: Most Recent Primary Care Visit:  Provider: Mazie Speed  Department: [id number]  Visit Type: PHYSICAL  Date: 04/01/2023  Medication: oxybutynin  (DITROPAN -XL) 10 MG 24 hr tablet [[id number]]  Has the patient contacted their pharmacy? No (Agent: If no, request that the patient contact the pharmacy for the refill. If patient does not wish to contact the pharmacy document the reason why and proceed with request.) (Agent: If yes, when and what did the pharmacy advise?)  Is this the correct pharmacy for this prescription? Yes If no, delete pharmacy and type the correct one.  This is the patient's preferred pharmacy:  [facility] [address] [place] - [address] [place] [address] Phone: [phone] Fax: [phone]   Has the prescription been filled recently? No  Is the patient out of the medication? Yes  Has the patient been seen for an appointment in the last year OR does the patient have an upcoming appointment? Yes  Can we respond through MyChart? Yes  Agent: Please be advised that Rx refills may take up to 3 business days. We ask that you follow-up with your pharmacy.

## 2023-08-14 MED ORDER — OXYBUTYNIN CHLORIDE ER 10 MG PO TB24: 10.0000 mg | ORAL_TABLET | Freq: Every day | ORAL | 0 refills | Status: DC

## 2023-08-14 NOTE — Telephone Encounter (Signed)
 Requested Prescriptions  Pending Prescriptions Disp Refills   oxybutynin  (DITROPAN -XL) 10 MG 24 hr tablet 90 tablet 0    Sig: Take 1 tablet (10 mg total) by mouth at bedtime.     Urology:  Bladder Agents Failed - 08/14/2023  1:52 PM      Failed - Valid encounter within last 12 months    Recent Outpatient Visits   None

## 2023-09-03 ENCOUNTER — Other Ambulatory Visit: Payer: Self-pay | Admitting: Cardiovascular Disease

## 2023-09-03 NOTE — Telephone Encounter (Signed)
 Prescription refill request for Eliquis  received. Indication: a fib Last office visit: 08/11/23 Scr: 0.82 04/01/23 epic Age: 83 Weight: 58kg  Called and spoke with patient and advised of dose decrease

## 2023-09-23 DIAGNOSIS — H25042 Posterior subcapsular polar age-related cataract, left eye: Secondary | ICD-10-CM | POA: Diagnosis not present

## 2023-09-23 DIAGNOSIS — H04123 Dry eye syndrome of bilateral lacrimal glands: Secondary | ICD-10-CM | POA: Diagnosis not present

## 2023-09-23 DIAGNOSIS — H2513 Age-related nuclear cataract, bilateral: Secondary | ICD-10-CM | POA: Diagnosis not present

## 2023-09-24 ENCOUNTER — Ambulatory Visit: Payer: Self-pay

## 2023-09-24 DIAGNOSIS — Z0000 Encounter for general adult medical examination without abnormal findings: Secondary | ICD-10-CM | POA: Diagnosis not present

## 2023-09-24 NOTE — Progress Notes (Signed)
 Subjective:   Emma Villarreal is a 83 y.o. who presents for a Medicare Wellness preventive visit.  As a reminder, Annual Wellness Visits don't include a physical exam, and some assessments may be limited, especially if this visit is performed virtually. We may recommend an in-person follow-up visit with your provider if needed.  Visit Complete: Virtual I connected with  Emma Villarreal on 09/24/23 by a audio enabled telemedicine application and verified that I am speaking with the correct person using two identifiers.  Patient Location: Home  Provider Location: Home Office  I discussed the limitations of evaluation and management by telemedicine. The patient expressed understanding and agreed to proceed.  Vital Signs: Because this visit was a virtual/telehealth visit, some criteria may be missing or patient reported. Any vitals not documented were not able to be obtained and vitals that have been documented are patient reported.  VideoDeclined- This patient declined Librarian, academic. Therefore the visit was completed with audio only.  Persons Participating in Visit: Patient.  AWV Questionnaire: No: Patient Medicare AWV questionnaire was not completed prior to this visit.  Cardiac Risk Factors include: advanced age (>69men, >75 women);dyslipidemia;smoking/ tobacco exposure     Objective:     There were no vitals filed for this visit. There is no height or weight on file to calculate BMI.     09/24/2023    2:41 PM 09/11/2022    1:56 PM 06/25/2021   11:08 AM 06/19/2020   10:53 AM  Advanced Directives  Does Patient Have a Medical Advance Directive? No Yes No Yes  Type of Aeronautical engineer of [place];Living will  Copy of Healthcare Power of Attorney in Chart?    No - copy requested  Would patient like information on creating a medical advance directive? No - Patient declined  No - Patient declined     Current Medications  (verified) Outpatient Encounter Medications as of 09/24/2023  Medication Sig   apixaban  (ELIQUIS ) 2.5 MG TABS tablet Take 1 tablet (2.5 mg total) by mouth 2 (two) times daily.   atorvastatin  (LIPITOR) 10 MG tablet TAKE 1 TABLET BY MOUTH ONCE DAILY AT BEDTIME   Cholecalciferol 50 MCG (2000 UT) TABS Take by mouth daily.   Cyanocobalamin  (B-12 PO) Take by mouth daily in the afternoon.   levothyroxine  (SYNTHROID ) 50 MCG tablet TAKE 1 TABLET BY MOUTH BEFORE BREAKFAST   metoprolol  succinate (TOPROL -XL) 50 MG 24 hr tablet Take 1 tablet (50 mg total) by mouth daily.   metoprolol  tartrate (LOPRESSOR ) 25 MG tablet Take 1 tablet (25 mg total) by mouth 2 (two) times daily as needed. Take for tachycardiac   montelukast  (SINGULAIR ) 10 MG tablet TAKE 1 TABLET BY MOUTH AT BEDTIME   oxybutynin  (DITROPAN -XL) 10 MG 24 hr tablet Take 1 tablet (10 mg total) by mouth at bedtime.   oxybutynin  (DITROPAN -XL) 5 MG 24 hr tablet TAKE 1 TABLET BY MOUTH AT BEDTIME   ELDERBERRY PO Take by mouth daily at 8 pm. (Patient not taking: Reported on 09/24/2023)   No facility-administered encounter medications on file as of 09/24/2023.    Allergies (verified) Molds & smuts and Varenicline   History: Past Medical History:  Diagnosis Date   A-fib [facility])    Allergy    Annual physical exam 02/28/2022   Arthritis    Cataract    COPD (chronic obstructive pulmonary disease) (HCC)    patient reported not validated   Hyperlipidemia    Hypothyroidism    Osteopenia  Past Surgical History:  Procedure Laterality Date   CHOLECYSTECTOMY     VAGINAL HYSTERECTOMY     Family History  Problem Relation Age of Onset   Diabetes Daughter    Stroke Maternal Grandmother    Stroke Mother    Dementia Mother    Diabetes Daughter    Colon cancer Maternal Aunt    Breast cancer Neg Hx    Social History   Socioeconomic History   Marital status: Widowed    Spouse name: Not on file   Number of children: 5   Years of education: Not on file    Highest education level: High school graduate  Occupational History   Occupation: retired    Comment: Print production planner   Tobacco Use   Smoking status: Every Day    Current packs/day: 1.00    Average packs/day: 1 pack/day for 60.0 years (60.0 ttl pk-yrs)    Types: Cigarettes   Smokeless tobacco: Never  Vaping Use   Vaping status: Never Used  Substance and Sexual Activity   Alcohol use: Not Currently   Drug use: Never   Sexual activity: Not Currently  Other Topics Concern   Not on file  Social History Narrative   Not on file   Social Drivers of Health   Financial Resource Strain: Low Risk  (09/24/2023)   Overall Financial Resource Strain (CARDIA)    Difficulty of Paying Living Expenses: Not hard at all  Food Insecurity: No Food Insecurity (09/24/2023)   Hunger Vital Sign    Worried About Running Out of Food in the Last Year: Never true    Ran Out of Food in the Last Year: Never true  Transportation Needs: No Transportation Needs (09/24/2023)   PRAPARE - Administrator, Civil Service (Medical): No    Lack of Transportation (Non-Medical): No  Physical Activity: Insufficiently Active (09/24/2023)   Exercise Vital Sign    Days of Exercise per Week: 7 days    Minutes of Exercise per Session: 10 min  Stress: No Stress Concern Present (09/24/2023)   [name] - Occupational Stress Questionnaire    Feeling of Stress : Not at all  Social Connections: Socially Isolated (09/24/2023)   Social Connection and Isolation Panel [NHANES]    Frequency of Communication with Friends and Family: More than three times a week    Frequency of Social Gatherings with Friends and Family: Once a week    Attends Religious Services: Never    Database administrator or Organizations: No    Attends Banker Meetings: Never    Marital Status: Widowed    Tobacco Counseling Ready to quit: Not Answered Counseling given: Not Answered    Clinical  Intake:  Pre-visit preparation completed: Yes  Pain : No/denies pain     BMI - recorded: 23.4 Nutritional Status: BMI of 19-24  Normal Nutritional Risks: None Diabetes: No  Lab Results  Component Value Date   HGBA1C 6.0 (H) 04/01/2023   HGBA1C 5.6 05/19/2020     How often do you need to have someone help you when you read instructions, pamphlets, or other written materials from your doctor or pharmacy?: 1 - Never  Interpreter Needed?: No  Information entered by :: Dellie Fergusson, LPN   Activities of Daily Living    09/24/2023    2:44 PM  In your present state of health, do you have any difficulty performing the following activities:  Hearing? 0  Vision? 0  Difficulty  concentrating or making decisions? 0  Walking or climbing stairs? 0  Dressing or bathing? 0  Doing errands, shopping? 0  Preparing Food and eating ? N  Using the Toilet? N  In the past six months, have you accidently leaked urine? N  Do you have problems with loss of bowel control? N  Managing your Medications? N  Managing your Finances? N  Housekeeping or managing your Housekeeping? N    Patient Care Team: Mazie Speed, MD as PCP - General (Family Medicine) Devorah Fonder, MD as PCP - Cardiology (Cardiology) Devorah Fonder, MD as Consulting Physician (Cardiology) Pa, [facility] (Optometry)  I have updated your Care Teams any recent Medical Services you may have received from other providers in the past year.     Assessment:    This is a routine wellness examination for Emma Villarreal.  Hearing/Vision screen Hearing Screening - Comments:: NO AIDS Vision Screening - Comments:: WEARS GLASSES ALL DAY[facility]   Goals Addressed             This Visit's Progress    DIET - INCREASE WATER INTAKE         Depression Screen     09/24/2023    2:38 PM 04/01/2023    2:26 PM 09/11/2022    1:49 PM 08/29/2022    4:17 PM 06/20/2022    9:15 AM 02/28/2022     4:03 PM 06/25/2021   11:06 AM  PHQ 2/9 Scores  PHQ - 2 Score 1 1 0 0 0 0 1  PHQ- 9 Score 1 6  3 5 2      Fall Risk     09/24/2023    2:43 PM 04/01/2023    2:26 PM 09/11/2022    1:46 PM 08/29/2022    4:17 PM 06/20/2022    9:14 AM  Fall Risk   Falls in the past year? 1 0 1 1 0  Number falls in past yr: 0 0 1 0 0  Injury with Fall? 0 0 0 0 0  Risk for fall due to : No Fall Risks No Fall Risks No Fall Risks History of fall(s) No Fall Risks  Follow up Falls evaluation completed  Education provided;Falls prevention discussed Falls evaluation completed Falls evaluation completed    MEDICARE RISK AT HOME:  Medicare Risk at Home Any stairs in or around the home?: Yes If so, are there any without handrails?: No Home free of loose throw rugs in walkways, pet beds, electrical cords, etc?: Yes Adequate lighting in your home to reduce risk of falls?: Yes Life alert?: No Use of a cane, walker or w/c?: No Grab bars in the bathroom?: Yes Shower chair or bench in shower?: No Elevated toilet seat or a handicapped toilet?: No  TIMED UP AND GO:  Was the test performed?  No  Cognitive Function: 6CIT completed        09/24/2023    2:48 PM 09/11/2022    2:00 PM  6CIT Screen  What Year? 0 points 0 points  What month? 0 points 0 points  What time? 0 points 0 points  Count back from 20 0 points 0 points  Months in reverse 0 points 0 points  Repeat phrase 0 points 0 points  Total Score 0 points 0 points    Immunizations Immunization History  Administered Date(s) Administered   Fluad Quad(high Dose 65+) 02/28/2022   Fluad Trivalent(High Dose 65+) 04/01/2023   Influenza, High  Dose Seasonal PF 02/09/2013, 06/12/2015, 02/02/2016, 01/21/2019, 05/02/2020   PFIZER(Purple Top)SARS-COV-2 Vaccination 05/17/2019, 06/07/2019, 02/26/2020   PNEUMOCOCCAL CONJUGATE-20 11/07/2020   Pneumococcal Polysaccharide-23 08/02/2009   Zoster, Live 08/02/2009    Screening Tests Health Maintenance  Topic Date  Due   DTaP/Tdap/Td (1 - Tdap) Never done   Zoster Vaccines- Shingrix (1 of 2) 12/16/1959   COVID-19 Vaccine (4 - 2024-25 season) 12/22/2022   INFLUENZA VACCINE  11/21/2023   Medicare Annual Wellness (AWV)  09/23/2024   DEXA SCAN  06/09/2026   Pneumonia Vaccine 36+ Years old  Completed   HPV VACCINES  Aged Out   Meningococcal B Vaccine  Aged Out    Health Maintenance  Health Maintenance Due  Topic Date Due   DTaP/Tdap/Td (1 - Tdap) Never done   Zoster Vaccines- Shingrix (1 of 2) 12/16/1959   COVID-19 Vaccine (4 - 2024-25 season) 12/22/2022   Health Maintenance Items Addressed: AGED OUT OF COLONOSCOPY, MAMMOGRAM; UP TO DATE ON BDS; UP TO DATE ON SHOTS EXCEPT SHINGRIX & COVID  Additional Screening:  Vision Screening: Recommended annual ophthalmology exams for early detection of glaucoma and other disorders of the eye. Would you like a referral to an eye doctor? No    Dental Screening: Recommended annual dental exams for proper oral hygiene  Community Resource Referral / Chronic Care Management: CRR required this visit?  No   CCM required this visit?  No   Plan:    I have personally reviewed and noted the following in the patient's chart:   Medical and social history Use of alcohol, tobacco or illicit drugs  Current medications and supplements including opioid prescriptions. Patient is not currently taking opioid prescriptions. Functional ability and status Nutritional status Physical activity Advanced directives List of other physicians Hospitalizations, surgeries, and ER visits in previous 12 months Vitals Screenings to include cognitive, depression, and falls Referrals and appointments  In addition, I have reviewed and discussed with patient certain preventive protocols, quality metrics, and best practice recommendations. A written personalized care plan for preventive services as well as general preventive health recommendations were provided to  patient.   Pinky Bright, LPN   04/27/1094   After Visit Summary: (MyChart) Due to this being a telephonic visit, the after visit summary with patients personalized plan was offered to patient via MyChart   Notes: Nothing significant to report at this time.

## 2023-09-24 NOTE — Patient Instructions (Addendum)
 Emma Villarreal , Thank you for taking time out of your busy schedule to complete your Annual Wellness Visit with me. I enjoyed our conversation and look forward to speaking with you again next year. I, as well as your care team,  appreciate your ongoing commitment to your health goals. Please review the following plan we discussed and let me know if I can assist you in the future.   Follow up Visits: Next Medicare AWV with our clinical staff:   10/05/24 @ 1:50 PM BY PHONE Have you seen your provider in the last 6 months (3 months if uncontrolled diabetes)? Yes  Clinician Recommendations:  Aim for 30 minutes of exercise or brisk walking, 6-8 glasses of water, and 5 servings of fruits and vegetables each day. TAKE CARE!      This is a list of the screening recommended for you and due dates:  Health Maintenance  Topic Date Due   DTaP/Tdap/Td vaccine (1 - Tdap) Never done   Zoster (Shingles) Vaccine (1 of 2) 12/16/1959   COVID-19 Vaccine (4 - 2024-25 season) 12/22/2022   Flu Shot  11/21/2023   Medicare Annual Wellness Visit  09/23/2024   DEXA scan (bone density measurement)  06/09/2026   Pneumonia Vaccine  Completed   HPV Vaccine  Aged Out   Meningitis B Vaccine  Aged Out    Advanced directives: (ACP Link)Information on Advanced Care Planning can be found at [place]  Secretary of [place] Advance Health Care Directives Advance Health Care Directives. [email or web address]  Advance Care Planning is important because it:  [x]  Makes sure you receive the medical care that is consistent with your values, goals, and preferences  [x]  It provides guidance to your family and loved ones and reduces their decisional burden about whether or not they are making the right decisions based on your wishes.  Follow the link provided in your after visit summary or read over the paperwork we have mailed to you to help you started getting your Advance Directives in place. If you need assistance in completing these, please  reach out to us  so that we can help you!

## 2023-10-04 ENCOUNTER — Other Ambulatory Visit: Payer: Self-pay | Admitting: Cardiovascular Disease

## 2023-10-07 DIAGNOSIS — H2511 Age-related nuclear cataract, right eye: Secondary | ICD-10-CM | POA: Diagnosis not present

## 2023-10-07 DIAGNOSIS — H2512 Age-related nuclear cataract, left eye: Secondary | ICD-10-CM | POA: Diagnosis not present

## 2023-10-07 DIAGNOSIS — H2513 Age-related nuclear cataract, bilateral: Secondary | ICD-10-CM | POA: Diagnosis not present

## 2023-10-09 ENCOUNTER — Encounter: Payer: Self-pay | Admitting: Ophthalmology

## 2023-10-14 NOTE — Discharge Instructions (Signed)

## 2023-10-16 ENCOUNTER — Encounter: Payer: Self-pay | Admitting: Ophthalmology

## 2023-10-16 ENCOUNTER — Ambulatory Visit
Admission: RE | Admit: 2023-10-16 | Discharge: 2023-10-16 | Disposition: A | Discharge status: Home / Self Care | Source: Ambulatory Visit | Attending: Ophthalmology | Admitting: Ophthalmology

## 2023-10-16 ENCOUNTER — Ambulatory Visit: Payer: Self-pay | Admitting: Anesthesiology

## 2023-10-16 ENCOUNTER — Other Ambulatory Visit: Payer: Self-pay

## 2023-10-16 ENCOUNTER — Encounter
Admission: RE | Disposition: A | Discharge status: Home / Self Care | Payer: Self-pay | Source: Ambulatory Visit | Attending: Ophthalmology

## 2023-10-16 DIAGNOSIS — J449 Chronic obstructive pulmonary disease, unspecified: Secondary | ICD-10-CM | POA: Insufficient documentation

## 2023-10-16 DIAGNOSIS — I4891 Unspecified atrial fibrillation: Secondary | ICD-10-CM | POA: Diagnosis not present

## 2023-10-16 DIAGNOSIS — H2511 Age-related nuclear cataract, right eye: Secondary | ICD-10-CM | POA: Diagnosis not present

## 2023-10-16 DIAGNOSIS — E039 Hypothyroidism, unspecified: Secondary | ICD-10-CM | POA: Diagnosis not present

## 2023-10-16 DIAGNOSIS — I480 Paroxysmal atrial fibrillation: Secondary | ICD-10-CM | POA: Diagnosis not present

## 2023-10-16 DIAGNOSIS — F17210 Nicotine dependence, cigarettes, uncomplicated: Secondary | ICD-10-CM | POA: Insufficient documentation

## 2023-10-16 HISTORY — PX: CATARACT EXTRACTION W/PHACO: SHX586

## 2023-10-16 SURGERY — PHACOEMULSIFICATION, CATARACT, WITH IOL INSERTION
Anesthesia: Monitor Anesthesia Care | Laterality: Right

## 2023-10-16 MED ORDER — MOXIFLOXACIN HCL 0.5 % OP SOLN
OPHTHALMIC | Status: DC | PRN
  Administered 2023-10-16: .2 mL via OPHTHALMIC

## 2023-10-16 MED ORDER — LIDOCAINE HCL (PF) 2 % IJ SOLN
INTRAOCULAR | Status: DC | PRN
  Administered 2023-10-16: 1 mL via INTRAOCULAR

## 2023-10-16 MED ORDER — ARMC OPHTHALMIC DILATING DROPS
OPHTHALMIC | Status: AC
  Filled 2023-10-16: qty 0.5

## 2023-10-16 MED ORDER — SIGHTPATH DOSE#1 BSS IO SOLN
INTRAOCULAR | Status: DC | PRN
  Administered 2023-10-16: 74 mL via OPHTHALMIC

## 2023-10-16 MED ORDER — FENTANYL CITRATE (PF) 100 MCG/2ML IJ SOLN
INTRAMUSCULAR | Status: DC | PRN
  Administered 2023-10-16: 50 ug via INTRAVENOUS

## 2023-10-16 MED ORDER — MIDAZOLAM HCL 2 MG/2ML IJ SOLN
INTRAMUSCULAR | Status: AC
  Filled 2023-10-16: qty 2

## 2023-10-16 MED ORDER — FENTANYL CITRATE (PF) 100 MCG/2ML IJ SOLN
INTRAMUSCULAR | Status: AC
  Filled 2023-10-16: qty 2

## 2023-10-16 MED ORDER — MIDAZOLAM HCL 2 MG/2ML IJ SOLN
INTRAMUSCULAR | Status: DC | PRN
  Administered 2023-10-16: 1 mg via INTRAVENOUS

## 2023-10-16 MED ORDER — EPHEDRINE SULFATE (PRESSORS) 50 MG/ML IJ SOLN
INTRAMUSCULAR | Status: DC | PRN
Start: 2023-10-16 — End: 2023-10-16
  Administered 2023-10-16: 5 mg via INTRAVENOUS

## 2023-10-16 MED ORDER — SIGHTPATH DOSE#1 BSS IO SOLN
INTRAOCULAR | Status: DC | PRN
  Administered 2023-10-16: 15 mL via INTRAOCULAR

## 2023-10-16 MED ORDER — TETRACAINE HCL 0.5 % OP SOLN
1.0000 [drp] | OPHTHALMIC | Status: DC | PRN
  Administered 2023-10-16 (×3): 1 [drp] via OPHTHALMIC

## 2023-10-16 MED ORDER — ARMC OPHTHALMIC DILATING DROPS
1.0000 | OPHTHALMIC | Status: DC | PRN
  Administered 2023-10-16 (×3): 1 via OPHTHALMIC

## 2023-10-16 MED ORDER — TETRACAINE HCL 0.5 % OP SOLN
OPHTHALMIC | Status: AC
  Filled 2023-10-16: qty 4

## 2023-10-16 MED ORDER — SIGHTPATH DOSE#1 NA HYALUR & NA CHOND-NA HYALUR IO KIT
PACK | INTRAOCULAR | Status: DC | PRN
  Administered 2023-10-16: 1 via OPHTHALMIC

## 2023-10-16 MED ORDER — BRIMONIDINE TARTRATE-TIMOLOL 0.2-0.5 % OP SOLN
OPHTHALMIC | Status: DC | PRN
Start: 2023-10-16 — End: 2023-10-16
  Administered 2023-10-16: 1 [drp] via OPHTHALMIC

## 2023-10-16 SURGICAL SUPPLY — 12 items
CATARACT SUITE SIGHTPATH (MISCELLANEOUS) ×1 IMPLANT
DISSECTOR HYDRO NUCLEUS 50X22 (MISCELLANEOUS) ×1 IMPLANT
DRSG TEGADERM 2-3/8X2-3/4 SM (GAUZE/BANDAGES/DRESSINGS) ×1 IMPLANT
FEE CATARACT SUITE SIGHTPATH (MISCELLANEOUS) ×1 IMPLANT
GLOVE BIOGEL PI IND STRL 8 (GLOVE) ×1 IMPLANT
GLOVE SURG LX STRL 7.5 STRW (GLOVE) ×1 IMPLANT
GLOVE SURG PROTEXIS BL SZ6.5 (GLOVE) ×1 IMPLANT
GLOVE SURG SYN 6.5 PF PI BL (GLOVE) ×1 IMPLANT
LENS IOL TECNIS MONO 20.5 (Intraocular Lens) IMPLANT
NDL FILTER BLUNT 18X1 1/2 (NEEDLE) ×1 IMPLANT
NEEDLE FILTER BLUNT 18X1 1/2 (NEEDLE) ×1 IMPLANT
SYR 3ML LL SCALE MARK (SYRINGE) ×1 IMPLANT

## 2023-10-16 NOTE — Transfer of Care (Signed)
 Immediate Anesthesia Transfer of Care Note  Patient: Emma Villarreal  Procedure(s) Performed: PHACOEMULSIFICATION, CATARACT, WITH IOL INSERTION 9.46, 01:03.6 (Right)  Patient Location: PACU  Anesthesia Type: MAC  Level of Consciousness: awake, alert  and patient cooperative  Airway and Oxygen Therapy: Patient Spontanous Breathing and Patient connected to supplemental oxygen  Post-op Assessment: Post-op Vital signs reviewed, Patient's Cardiovascular Status Stable, Respiratory Function Stable, Patent Airway and No signs of Nausea or vomiting  Post-op Vital Signs: Reviewed and stable  Complications: No notable events documented.

## 2023-10-16 NOTE — Anesthesia Postprocedure Evaluation (Signed)
 Anesthesia Post Note  Patient: Emma Villarreal  Procedure(s) Performed: PHACOEMULSIFICATION, CATARACT, WITH IOL INSERTION 9.46, 01:03.6 (Right)  Patient location during evaluation: PACU Anesthesia Type: MAC Level of consciousness: awake and alert Pain management: pain level controlled Vital Signs Assessment: post-procedure vital signs reviewed and stable Respiratory status: spontaneous breathing, nonlabored ventilation, respiratory function stable and patient connected to nasal cannula oxygen Cardiovascular status: stable and blood pressure returned to baseline Postop Assessment: no apparent nausea or vomiting Anesthetic complications: no   No notable events documented.   Last Vitals:  Vitals:   10/16/23 0832 10/16/23 0837  BP:  (!) 105/57  Pulse: 73 90  Resp: 20 (!) 25  Temp:    SpO2: 96% 92%    Last Pain:  Vitals:   10/16/23 0837  TempSrc:   PainSc: 0-No pain                 [name]

## 2023-10-16 NOTE — Anesthesia Preprocedure Evaluation (Signed)
 Anesthesia Evaluation  Patient identified by MRN, date of birth, ID band Patient awake    Reviewed: Allergy & Precautions, NPO status , Patient's Chart, lab work & pertinent test results  History of Anesthesia Complications Negative for: history of anesthetic complications  Airway Mallampati: II  TM Distance: >3 FB Neck ROM: full    Dental  (+) Upper Dentures, Edentulous Lower   Pulmonary COPD, Current Smoker and Patient abstained from smoking.   Pulmonary exam normal        Cardiovascular Normal cardiovascular exam+ dysrhythmias Atrial Fibrillation      Neuro/Psych negative neurological ROS  negative psych ROS   GI/Hepatic negative GI ROS, Neg liver ROS,,,  Endo/Other  Hypothyroidism    Renal/GU      Musculoskeletal   Abdominal   Peds  Hematology negative hematology ROS (+)   Anesthesia Other Findings Past Medical History: No date: A-fib (HCC) No date: Allergy 02/28/2022: Annual physical exam No date: Arthritis No date: Cataract No date: COPD (chronic obstructive pulmonary disease) (HCC)     Comment:  patient reported not validated No date: Hyperlipidemia No date: Hypothyroidism No date: Osteopenia  Past Surgical History: No date: CHOLECYSTECTOMY No date: VAGINAL HYSTERECTOMY  BMI    Body Mass Index: 23.78 kg/m      Reproductive/Obstetrics negative OB ROS                             Anesthesia Physical Anesthesia Plan  ASA: 3  Anesthesia Plan: MAC   Post-op Pain Management: Minimal or no pain anticipated   Induction: Intravenous  PONV Risk Score and Plan:   Airway Management Planned: Natural Airway and Nasal Cannula  Additional Equipment:   Intra-op Plan:   Post-operative Plan:   Informed Consent: I have reviewed the patients History and Physical, chart, labs and discussed the procedure including the risks, benefits and alternatives for the proposed  anesthesia with the patient or authorized representative who has indicated his/her understanding and acceptance.     Dental Advisory Given  Plan Discussed with: Anesthesiologist, CRNA and Surgeon  Anesthesia Plan Comments: (Patient consented for risks of anesthesia including but not limited to:  - adverse reactions to medications - damage to eyes, teeth, lips or other oral mucosa - nerve damage due to positioning  - sore throat or hoarseness - Damage to heart, brain, nerves, lungs, other parts of body or loss of life  Patient voiced understanding and assent.)        Anesthesia Quick Evaluation

## 2023-10-16 NOTE — Op Note (Signed)
 OPERATIVE NOTE  Emma Villarreal [id number] 10/16/2023   PREOPERATIVE DIAGNOSIS: Nuclear sclerotic cataract right eye. H25.11   POSTOPERATIVE DIAGNOSIS: Nuclear sclerotic cataract right eye. H25.11   PROCEDURE:  Phacoemusification with posterior chamber intraocular lens placement of the right eye  Ultrasound time: Procedure(s): PHACOEMULSIFICATION, CATARACT, WITH IOL INSERTION 9.46, 01:03.6 (Right)  LENS:   Implant Name Type Inv. Item Serial No. Manufacturer Lot No. LRB No. Used Action  LENS IOL TECNIS MONO 20.5 - [id number] Intraocular Lens LENS IOL TECNIS MONO 20.5 [id number] SIGHTPATH  Right 1 Implanted      SURGEON:  Feliciano HERO. Enola, MD   ANESTHESIA:  Topical with tetracaine drops, augmented with 1% preservative-free intracameral lidocaine.   COMPLICATIONS:  None.   DESCRIPTION OF PROCEDURE:  The patient was identified in the holding room and transported to the operating room and placed in the supine position under the operating microscope.  The right eye was identified as the operative eye, which was prepped and draped in the usual sterile ophthalmic fashion.   A 1 millimeter clear-corneal paracentesis was made superotemporally. Preservative-free 1% lidocaine mixed with 1:1,000 bisulfite-free aqueous solution of epinephrine was injected into the anterior chamber. The anterior chamber was then filled with Viscoat viscoelastic. A 2.4 millimeter keratome was used to make a clear-corneal incision inferotemporally. A curvilinear capsulorrhexis was made with a cystotome and capsulorrhexis forceps. Balanced salt solution was used to hydrodissect and hydrodelineate the nucleus. Phacoemulsification was then used to remove the lens nucleus and epinucleus. The remaining cortex was then removed using the irrigation and aspiration handpiece. Provisc was then placed into the capsular bag to distend it for lens placement. A +20.50 D DCB00 intraocular lens was then injected into the capsular bag. The  remaining viscoelastic was aspirated.   Wounds were hydrated with balanced salt solution.  The anterior chamber was inflated to a physiologic pressure with balanced salt solution.  No wound leaks were noted. Moxifloxacin was injected intracamerally.  Timolol and Brimonidine drops were applied to the eye.  The patient was taken to the recovery room in stable condition without complications of anesthesia or surgery.  Feliciano Hugger [place] 10/16/2023, 8:29 AM

## 2023-10-16 NOTE — H&P (Signed)
 [facility]   Primary Care Physician:  Myrla Jon HERO, MD Ophthalmologist: Dr. Feliciano Ober  Pre-Procedure History & Physical: HPI:  Emma Villarreal is a 83 y.o. female here for cataract surgery.   Past Medical History:  Diagnosis Date   A-fib [facility])    Allergy    Annual physical exam 02/28/2022   Arthritis    Cataract    COPD (chronic obstructive pulmonary disease) (HCC)    patient reported not validated   Hyperlipidemia    Hypothyroidism    Osteopenia     Past Surgical History:  Procedure Laterality Date   CHOLECYSTECTOMY     VAGINAL HYSTERECTOMY      Prior to Admission medications   Medication Sig Start Date End Date Taking? Authorizing Provider  apixaban  (ELIQUIS ) 2.5 MG TABS tablet Take 1 tablet (2.5 mg total) by mouth 2 (two) times daily. 09/03/23  Yes Gollan, Timothy J, MD  atorvastatin  (LIPITOR) 10 MG tablet TAKE 1 TABLET BY MOUTH ONCE DAILY AT BEDTIME 10/06/23  Yes Gollan, Timothy J, MD  Cholecalciferol 50 MCG (2000 UT) TABS Take by mouth daily.   Yes [provider]  Cyanocobalamin  (B-12 PO) Take by mouth daily in the afternoon.   Yes [provider]  ELDERBERRY PO Take by mouth daily at 8 pm.   Yes [provider]  levothyroxine  (SYNTHROID ) 50 MCG tablet TAKE 1 TABLET BY MOUTH BEFORE BREAKFAST 07/24/23  Yes Bacigalupo, Jon HERO, MD  metoprolol  succinate (TOPROL -XL) 50 MG 24 hr tablet Take 1 tablet (50 mg total) by mouth daily. 01/28/23  Yes Gollan, Timothy J, MD  metoprolol  tartrate (LOPRESSOR ) 25 MG tablet Take 1 tablet (25 mg total) by mouth 2 (two) times daily as needed. Take for tachycardiac 01/28/23  Yes Gollan, Timothy J, MD  montelukast  (SINGULAIR ) 10 MG tablet TAKE 1 TABLET BY MOUTH AT BEDTIME 07/24/23  Yes Bacigalupo, Angela M, MD  oxybutynin  (DITROPAN -XL) 10 MG 24 hr tablet Take 1 tablet (10 mg total) by mouth at bedtime. 08/14/23  Yes Bacigalupo, Jon HERO, MD  oxybutynin  (DITROPAN -XL) 5 MG 24 hr tablet TAKE 1 TABLET BY MOUTH  AT BEDTIME 01/27/23  Yes Myrla Jon HERO, MD    Allergies as of 09/25/2023 - Review Complete 09/24/2023  Allergen Reaction Noted   Molds & smuts Anaphylaxis 05/02/2020   Varenicline Rash 06/12/2015    Family History  Problem Relation Age of Onset   Diabetes Daughter    Stroke Maternal Grandmother    Stroke Mother    Dementia Mother    Diabetes Daughter    Colon cancer Maternal Aunt    Breast cancer Neg Hx     Social History   Socioeconomic History   Marital status: Widowed    Spouse name: Not on file   Number of children: 5   Years of education: Not on file   Highest education level: High school graduate  Occupational History   Occupation: retired    Comment: Print production planner   Tobacco Use   Smoking status: Every Day    Current packs/day: 1.00    Average packs/day: 1 pack/day for 60.0 years (60.0 ttl pk-yrs)    Types: Cigarettes   Smokeless tobacco: Never  Vaping Use   Vaping status: Never Used  Substance and Sexual Activity   Alcohol use: Not Currently   Drug use: Never   Sexual activity: Not Currently  Other Topics Concern   Not on file  Social History Narrative   Not on file   Social  Drivers of Corporate investment banker Strain: Low Risk  (09/24/2023)   Overall Financial Resource Strain (CARDIA)    Difficulty of Paying Living Expenses: Not hard at all  Food Insecurity: No Food Insecurity (09/24/2023)   Hunger Vital Sign    Worried About Running Out of Food in the Last Year: Never true    Ran Out of Food in the Last Year: Never true  Transportation Needs: No Transportation Needs (09/24/2023)   PRAPARE - Administrator, Civil Service (Medical): No    Lack of Transportation (Non-Medical): No  Physical Activity: Insufficiently Active (09/24/2023)   Exercise Vital Sign    Days of Exercise per Week: 7 days    Minutes of Exercise per Session: 10 min  Stress: No Stress Concern Present (09/24/2023)   [name] -  Occupational Stress Questionnaire    Feeling of Stress : Not at all  Social Connections: Socially Isolated (09/24/2023)   Social Connection and Isolation Panel    Frequency of Communication with Friends and Family: More than three times a week    Frequency of Social Gatherings with Friends and Family: Once a week    Attends Religious Services: Never    Database administrator or Organizations: No    Attends Banker Meetings: Never    Marital Status: Widowed  Intimate Partner Violence: Not At Risk (09/24/2023)   Humiliation, Afraid, Rape, and Kick questionnaire    Fear of Current or Ex-Partner: No    Emotionally Abused: No    Physically Abused: No    Sexually Abused: No    Review of Systems: See HPI, otherwise negative ROS  Physical Exam: Ht 5' 1.5 (1.562 m)   Wt 58.1 kg   BMI 23.79 kg/m  General:   Alert, cooperative in NAD Head:  Normocephalic and atraumatic. Respiratory:  Normal work of breathing. Cardiovascular:  RRR  Impression/Plan: Emma Villarreal is here for cataract surgery.  Risks, benefits, limitations, and alternatives regarding cataract surgery have been reviewed with the patient.  Questions have been answered.  All parties agreeable.   Feliciano Bryan Ober, MD  10/16/2023, 7:07 AM

## 2023-10-17 ENCOUNTER — Encounter: Payer: Self-pay | Admitting: Ophthalmology

## 2023-10-17 DIAGNOSIS — H2512 Age-related nuclear cataract, left eye: Secondary | ICD-10-CM | POA: Diagnosis not present

## 2023-10-21 ENCOUNTER — Other Ambulatory Visit: Payer: Self-pay | Admitting: Family Medicine

## 2023-10-21 ENCOUNTER — Telehealth: Payer: Self-pay | Admitting: Family Medicine

## 2023-10-21 NOTE — Telephone Encounter (Signed)
 Patient advised.

## 2023-10-21 NOTE — Telephone Encounter (Signed)
 Copied from CRM [id number]. Topic: Clinical - Medication Refill >> Oct 21, 2023  2:58 PM Yolanda T wrote: Medication: atorvastatin  (LIPITOR) 10 MG tablet  Has the patient contacted their pharmacy? No  This is the patient's preferred pharmacy:  [facility] [address] [place] - [address] [place] [address] Phone: [phone] Fax: [phone]  Is this the correct pharmacy for this prescription? Yes If no, delete pharmacy and type the correct one.   Has the prescription been filled recently? Yes  Is the patient out of the medication? No  Has the patient been seen for an appointment in the last year OR does the patient have an upcoming appointment? Yes  Can we respond through MyChart? No  Agent: Please be advised that Rx refills may take up to 3 business days. We ask that you follow-up with your pharmacy.

## 2023-10-21 NOTE — Anesthesia Preprocedure Evaluation (Addendum)
 Anesthesia Evaluation  Patient identified by MRN, date of birth, ID band Patient awake    Reviewed: Allergy & Precautions, H&P , NPO status , Patient's Chart, lab work & pertinent test results  Airway Mallampati: II  TM Distance: >3 FB Neck ROM: Full    Dental no notable dental hx. (+) Upper Dentures, Edentulous Lower Upper Dentures, Edentulous Lower:   Pulmonary neg pulmonary ROS, COPD, Current Smoker and Patient abstained from smoking.   Pulmonary exam normal breath sounds clear to auscultation       Cardiovascular negative cardio ROS Normal cardiovascular exam Rhythm:Regular Rate:Normal  Atrial fibrillation  Echo 01-09-19 but can't see results   Neuro/Psych negative neurological ROS  negative psych ROS   GI/Hepatic negative GI ROS, Neg liver ROS,,,  Endo/Other  negative endocrine ROSHypothyroidism    Renal/GU negative Renal ROS  negative genitourinary   Musculoskeletal negative musculoskeletal ROS (+) Arthritis ,    Abdominal   Peds negative pediatric ROS (+)  Hematology negative hematology ROS (+)   Anesthesia Other Findings 10-16-23 previous cataract surgery Dr. Chesley  Medical History  Allergy Arthritis Cataract COPD (chronic obstructive pulmonary disease) (HCC) Hyperlipidemia Osteopenia A-fib (HCC) Hypothyroidism Annual physical exam      Reproductive/Obstetrics negative OB ROS                              Anesthesia Physical Anesthesia Plan  ASA: 3  Anesthesia Plan: MAC   Post-op Pain Management:    Induction: Intravenous  PONV Risk Score and Plan:   Airway Management Planned: Natural Airway and Nasal Cannula  Additional Equipment:   Intra-op Plan:   Post-operative Plan:   Informed Consent: I have reviewed the patients History and Physical, chart, labs and discussed the procedure including the risks, benefits and alternatives for the proposed anesthesia  with the patient or authorized representative who has indicated his/her understanding and acceptance.     Dental Advisory Given  Plan Discussed with: Anesthesiologist, CRNA and Surgeon  Anesthesia Plan Comments: (Patient consented for risks of anesthesia including but not limited to:  - adverse reactions to medications - damage to eyes, teeth, lips or other oral mucosa - nerve damage due to positioning  - sore throat or hoarseness - Damage to heart, brain, nerves, lungs, other parts of body or loss of life  Patient voiced understanding and assent.)         Anesthesia Quick Evaluation

## 2023-10-21 NOTE — Telephone Encounter (Signed)
 [facility] called and spoke to Emma Villarreal, Pensions consultant about the refill(s) atorvastatin  requested. Advised it was sent on 10/06/23 #90/2 refill(s). She says it was put on hold, but they can have it ready in 1 hour.

## 2023-10-25 ENCOUNTER — Other Ambulatory Visit: Payer: Self-pay | Admitting: Family Medicine

## 2023-10-25 DIAGNOSIS — J209 Acute bronchitis, unspecified: Secondary | ICD-10-CM

## 2023-10-28 NOTE — Telephone Encounter (Signed)
 Requested Prescriptions  Pending Prescriptions Disp Refills   montelukast  (SINGULAIR ) 10 MG tablet [Pharmacy Med Name: Montelukast  Sodium 10 MG Oral Tablet] 90 tablet 1    Sig: TAKE 1 TABLET BY MOUTH AT BEDTIME     Pulmonology:  Leukotriene Inhibitors Passed - 10/28/2023 12:10 PM      Passed - Valid encounter within last 12 months    Recent Outpatient Visits   None

## 2023-11-07 ENCOUNTER — Other Ambulatory Visit: Payer: Self-pay | Admitting: Family Medicine

## 2023-11-18 NOTE — Discharge Instructions (Signed)

## 2023-11-20 ENCOUNTER — Ambulatory Visit
Admission: RE | Admit: 2023-11-20 | Discharge: 2023-11-20 | Disposition: A | Discharge status: Home / Self Care | Source: Ambulatory Visit | Attending: Ophthalmology | Admitting: Ophthalmology

## 2023-11-20 ENCOUNTER — Other Ambulatory Visit: Payer: Self-pay

## 2023-11-20 ENCOUNTER — Encounter
Admission: RE | Disposition: A | Discharge status: Home / Self Care | Payer: Self-pay | Source: Ambulatory Visit | Attending: Ophthalmology

## 2023-11-20 ENCOUNTER — Encounter: Payer: Self-pay | Admitting: Ophthalmology

## 2023-11-20 ENCOUNTER — Ambulatory Visit: Payer: Self-pay | Admitting: Anesthesiology

## 2023-11-20 DIAGNOSIS — H2512 Age-related nuclear cataract, left eye: Secondary | ICD-10-CM | POA: Diagnosis not present

## 2023-11-20 DIAGNOSIS — H25042 Posterior subcapsular polar age-related cataract, left eye: Secondary | ICD-10-CM | POA: Diagnosis not present

## 2023-11-20 DIAGNOSIS — J449 Chronic obstructive pulmonary disease, unspecified: Secondary | ICD-10-CM | POA: Insufficient documentation

## 2023-11-20 DIAGNOSIS — F17210 Nicotine dependence, cigarettes, uncomplicated: Secondary | ICD-10-CM | POA: Insufficient documentation

## 2023-11-20 DIAGNOSIS — Z7901 Long term (current) use of anticoagulants: Secondary | ICD-10-CM | POA: Diagnosis not present

## 2023-11-20 DIAGNOSIS — Z79890 Hormone replacement therapy (postmenopausal): Secondary | ICD-10-CM | POA: Insufficient documentation

## 2023-11-20 DIAGNOSIS — I4891 Unspecified atrial fibrillation: Secondary | ICD-10-CM | POA: Diagnosis not present

## 2023-11-20 DIAGNOSIS — E039 Hypothyroidism, unspecified: Secondary | ICD-10-CM | POA: Diagnosis not present

## 2023-11-20 DIAGNOSIS — I480 Paroxysmal atrial fibrillation: Secondary | ICD-10-CM | POA: Diagnosis not present

## 2023-11-20 HISTORY — PX: CATARACT EXTRACTION W/PHACO: SHX586

## 2023-11-20 SURGERY — PHACOEMULSIFICATION, CATARACT, WITH IOL INSERTION
Anesthesia: Monitor Anesthesia Care | Site: Eye | Laterality: Left

## 2023-11-20 MED ORDER — FENTANYL CITRATE (PF) 100 MCG/2ML IJ SOLN
INTRAMUSCULAR | Status: DC | PRN
  Administered 2023-11-20: 50 ug via INTRAVENOUS

## 2023-11-20 MED ORDER — SIGHTPATH DOSE#1 BSS IO SOLN
INTRAOCULAR | Status: DC | PRN
  Administered 2023-11-20: 85 mL via OPHTHALMIC

## 2023-11-20 MED ORDER — MIDAZOLAM HCL 2 MG/2ML IJ SOLN
INTRAMUSCULAR | Status: AC
Start: 2023-11-20 — End: 2023-11-20
  Filled 2023-11-20: qty 2

## 2023-11-20 MED ORDER — ARMC OPHTHALMIC DILATING DROPS
1.0000 | OPHTHALMIC | Status: DC | PRN
  Administered 2023-11-20 (×3): 1 via OPHTHALMIC

## 2023-11-20 MED ORDER — SIGHTPATH DOSE#1 BSS IO SOLN
INTRAOCULAR | Status: DC | PRN
  Administered 2023-11-20: 15 mL via INTRAOCULAR

## 2023-11-20 MED ORDER — MIDAZOLAM HCL 2 MG/2ML IJ SOLN
INTRAMUSCULAR | Status: DC | PRN
  Administered 2023-11-20: 1 mg via INTRAVENOUS

## 2023-11-20 MED ORDER — TETRACAINE HCL 0.5 % OP SOLN
1.0000 [drp] | OPHTHALMIC | Status: DC | PRN
  Administered 2023-11-20 (×3): 1 [drp] via OPHTHALMIC

## 2023-11-20 MED ORDER — BRIMONIDINE TARTRATE-TIMOLOL 0.2-0.5 % OP SOLN
OPHTHALMIC | Status: DC | PRN
  Administered 2023-11-20: 1 [drp] via OPHTHALMIC

## 2023-11-20 MED ORDER — LACTATED RINGERS IV SOLN
INTRAVENOUS | Status: DC
Start: 2023-11-20 — End: 2023-11-20

## 2023-11-20 MED ORDER — EPHEDRINE 5 MG/ML INJ
INTRAVENOUS | Status: AC
  Filled 2023-11-20: qty 5

## 2023-11-20 MED ORDER — SIGHTPATH DOSE#1 NA HYALUR & NA CHOND-NA HYALUR IO KIT
PACK | INTRAOCULAR | Status: DC | PRN
  Administered 2023-11-20: 1 via OPHTHALMIC

## 2023-11-20 MED ORDER — LIDOCAINE HCL (PF) 2 % IJ SOLN
INTRAMUSCULAR | Status: DC | PRN
  Administered 2023-11-20: 4 mL via INTRAOCULAR

## 2023-11-20 MED ORDER — MOXIFLOXACIN HCL 0.5 % OP SOLN
OPHTHALMIC | Status: DC | PRN
  Administered 2023-11-20: .2 mL via OPHTHALMIC

## 2023-11-20 MED ORDER — EPHEDRINE SULFATE (PRESSORS) 50 MG/ML IJ SOLN
INTRAMUSCULAR | Status: DC | PRN
  Administered 2023-11-20: 5 mg via INTRAVENOUS

## 2023-11-20 MED ORDER — FENTANYL CITRATE (PF) 100 MCG/2ML IJ SOLN
INTRAMUSCULAR | Status: AC
  Filled 2023-11-20: qty 2

## 2023-11-20 SURGICAL SUPPLY — 11 items
CATARACT SUITE SIGHTPATH (MISCELLANEOUS) ×1 IMPLANT
DISSECTOR HYDRO NUCLEUS 50X22 (MISCELLANEOUS) ×1 IMPLANT
DRSG TEGADERM 2-3/8X2-3/4 SM (GAUZE/BANDAGES/DRESSINGS) ×1 IMPLANT
FEE CATARACT SUITE SIGHTPATH (MISCELLANEOUS) ×1 IMPLANT
GLOVE BIOGEL PI IND STRL 8 (GLOVE) ×1 IMPLANT
GLOVE SURG LX STRL 7.5 STRW (GLOVE) ×1 IMPLANT
GLOVE SURG SYN 6.5 PF PI BL (GLOVE) ×1 IMPLANT
LENS IOL TECNIS MONO 20.5 (Intraocular Lens) IMPLANT
NDL FILTER BLUNT 18X1 1/2 (NEEDLE) ×1 IMPLANT
NEEDLE FILTER BLUNT 18X1 1/2 (NEEDLE) ×1 IMPLANT
SYR 3ML LL SCALE MARK (SYRINGE) ×1 IMPLANT

## 2023-11-20 NOTE — Op Note (Signed)
 OPERATIVE NOTE  Ova Gillentine [id number] 11/20/2023   PREOPERATIVE DIAGNOSIS: Nuclear sclerotic cataract left eye. H25.12   POSTOPERATIVE DIAGNOSIS: Nuclear sclerotic cataract left eye. H25.12   PROCEDURE:  Phacoemusification with posterior chamber intraocular lens placement of the left eye  Ultrasound time: Procedure(s): PHACOEMULSIFICATION, CATARACT, WITH IOL INSERTION 9.69 00:56.3 (Left)  LENS:   Implant Name Type Inv. Item Serial No. Manufacturer Lot No. LRB No. Used Action  LENS IOL TECNIS MONO 20.5 - [id number] Intraocular Lens LENS IOL TECNIS MONO 20.5 [id number] [employer]PATH  Left 1 Implanted      SURGEON:  Feliciano HERO. Enola, MD   ANESTHESIA:  Topical with tetracaine  drops, augmented with 1% preservative-free intracameral lidocaine .   COMPLICATIONS:  None.   DESCRIPTION OF PROCEDURE:  The patient was identified in the holding room and transported to the operating room and placed in the supine position under the operating microscope.  The left eye was identified as the operative eye, which was prepped and draped in the usual sterile ophthalmic fashion.   A 1 millimeter clear-corneal paracentesis was made inferotemporally. Preservative-free 1% lidocaine  mixed with 1:1,000 bisulfite-free aqueous solution of epinephrine  was injected into the anterior chamber. The anterior chamber was then filled with Viscoat viscoelastic. A 2.4 millimeter keratome was used to make a clear-corneal incision superotemporally. A curvilinear capsulorrhexis was made with a cystotome and capsulorrhexis forceps. Balanced salt solution was used to hydrodissect and hydrodelineate the nucleus. Phacoemulsification was then used to remove the lens nucleus and epinucleus. The remaining cortex was then removed using the irrigation and aspiration handpiece. Provisc was then placed into the capsular bag to distend it for lens placement. A +20.50 D DCB00 intraocular lens was then injected into the capsular bag. The remaining  viscoelastic was aspirated.   Wounds were hydrated with balanced salt solution.  The anterior chamber was inflated to a physiologic pressure with balanced salt solution.  No wound leaks were noted. Moxifloxacin  was injected intracamerally.  Timolol  and Brimonidine  drops were applied to the eye.  The patient was taken to the recovery room in stable condition without complications of anesthesia or surgery.  Feliciano Hugger [place] 11/20/2023, 10:42 AM

## 2023-11-20 NOTE — H&P (Signed)
 [facility]   Primary Care Physician:  Myrla Jon HERO, MD Ophthalmologist: Dr. Feliciano Ober  Pre-Procedure History & Physical: HPI:  Emma Villarreal is a 83 y.o. female here for cataract surgery.   Past Medical History:  Diagnosis Date   A-fib [facility])    Allergy    Annual physical exam 02/28/2022   Arthritis    Cataract    COPD (chronic obstructive pulmonary disease) (HCC)    patient reported not validated   Hyperlipidemia    Hypothyroidism    Osteopenia     Past Surgical History:  Procedure Laterality Date   CATARACT EXTRACTION W/PHACO Right 10/16/2023   Procedure: PHACOEMULSIFICATION, CATARACT, WITH IOL INSERTION 9.46, 01:03.6;  Surgeon: Ober Feliciano Hugger, MD;  [facility];  Service: Ophthalmology;  Laterality: Right;   CHOLECYSTECTOMY     VAGINAL HYSTERECTOMY      Prior to Admission medications   Medication Sig Start Date End Date Taking? Authorizing Provider  apixaban  (ELIQUIS ) 2.5 MG TABS tablet Take 1 tablet (2.5 mg total) by mouth 2 (two) times daily. 09/03/23   Gollan, Timothy J, MD  atorvastatin  (LIPITOR) 10 MG tablet TAKE 1 TABLET BY MOUTH ONCE DAILY AT BEDTIME 10/06/23   Gollan, Timothy J, MD  Cholecalciferol 50 MCG (2000 UT) TABS Take by mouth daily.    [provider]  Cyanocobalamin  (B-12 PO) Take by mouth daily in the afternoon.    [provider]  ELDERBERRY PO Take by mouth daily at 8 pm.    [provider]  levothyroxine  (SYNTHROID ) 50 MCG tablet TAKE 1 TABLET BY MOUTH BEFORE BREAKFAST 10/21/23   Bacigalupo, Jon HERO, MD  metoprolol  succinate (TOPROL -XL) 50 MG 24 hr tablet Take 1 tablet (50 mg total) by mouth daily. 01/28/23   Gollan, Timothy J, MD  metoprolol  tartrate (LOPRESSOR ) 25 MG tablet Take 1 tablet (25 mg total) by mouth 2 (two) times daily as needed. Take for tachycardiac 01/28/23   Gollan, Timothy J, MD  montelukast  (SINGULAIR ) 10 MG tablet TAKE 1 TABLET BY MOUTH AT BEDTIME 10/28/23   Bacigalupo,  Angela M, MD  oxybutynin  (DITROPAN -XL) 10 MG 24 hr tablet TAKE 1 TABLET BY MOUTH AT BEDTIME 11/10/23   Bacigalupo, Angela M, MD  oxybutynin  (DITROPAN -XL) 5 MG 24 hr tablet TAKE 1 TABLET BY MOUTH AT BEDTIME 01/27/23   Myrla Jon HERO, MD    Allergies as of 09/25/2023 - Review Complete 09/24/2023  Allergen Reaction Noted   Molds & smuts Anaphylaxis 05/02/2020   Varenicline Rash 06/12/2015    Family History  Problem Relation Age of Onset   Diabetes Daughter    Stroke Maternal Grandmother    Stroke Mother    Dementia Mother    Diabetes Daughter    Colon cancer Maternal Aunt    Breast cancer Neg Hx     Social History   Socioeconomic History   Marital status: Widowed    Spouse name: Not on file   Number of children: 5   Years of education: Not on file   Highest education level: High school graduate  Occupational History   Occupation: retired    Comment: Print production planner   Tobacco Use   Smoking status: Every Day    Current packs/day: 1.00    Average packs/day: 1 pack/day for 60.0 years (60.0 ttl pk-yrs)    Types: Cigarettes   Smokeless tobacco: Never  Vaping Use   Vaping status: Never Used  Substance and Sexual Activity   Alcohol use: Not Currently  Drug use: Never   Sexual activity: Not Currently  Other Topics Concern   Not on file  Social History Narrative   Not on file   Social Drivers of Health   Financial Resource Strain: Low Risk  (09/24/2023)   Overall Financial Resource Strain (CARDIA)    Difficulty of Paying Living Expenses: Not hard at all  Food Insecurity: No Food Insecurity (09/24/2023)   Hunger Vital Sign    Worried About Running Out of Food in the Last Year: Never true    Ran Out of Food in the Last Year: Never true  Transportation Needs: No Transportation Needs (09/24/2023)   PRAPARE - Administrator, Civil Service (Medical): No    Lack of Transportation (Non-Medical): No  Physical Activity: Insufficiently Active (09/24/2023)   Exercise  Vital Sign    Days of Exercise per Week: 7 days    Minutes of Exercise per Session: 10 min  Stress: No Stress Concern Present (09/24/2023)   [name] - Occupational Stress Questionnaire    Feeling of Stress : Not at all  Social Connections: Socially Isolated (09/24/2023)   Social Connection and Isolation Panel    Frequency of Communication with Friends and Family: More than three times a week    Frequency of Social Gatherings with Friends and Family: Once a week    Attends Religious Services: Never    Database administrator or Organizations: No    Attends Banker Meetings: Never    Marital Status: Widowed  Intimate Partner Violence: Not At Risk (09/24/2023)   Humiliation, Afraid, Rape, and Kick questionnaire    Fear of Current or Ex-Partner: No    Emotionally Abused: No    Physically Abused: No    Sexually Abused: No    Review of Systems: See HPI, otherwise negative ROS  Physical Exam: There were no vitals taken for this visit. General:   Alert, cooperative in NAD Head:  Normocephalic and atraumatic. Respiratory:  Normal work of breathing. Cardiovascular:  RRR  Impression/Plan: Emma Villarreal is here for cataract surgery.  Risks, benefits, limitations, and alternatives regarding cataract surgery have been reviewed with the patient.  Questions have been answered.  All parties agreeable.   Feliciano Bryan Ober, MD  11/20/2023, 7:05 AM

## 2023-11-20 NOTE — Transfer of Care (Signed)
 Immediate Anesthesia Transfer of Care Note  Patient: Emma Villarreal  Procedure(s) Performed: PHACOEMULSIFICATION, CATARACT, WITH IOL INSERTION 9.69 00:56.3 (Left: Eye)  Patient Location: PACU  Anesthesia Type: MAC  Level of Consciousness: awake, alert  and patient cooperative  Airway and Oxygen Therapy: Patient Spontanous Breathing and Patient connected to supplemental oxygen  Post-op Assessment: Post-op Vital signs reviewed, Patient's Cardiovascular Status Stable, Respiratory Function Stable, Patent Airway and No signs of Nausea or vomiting  Post-op Vital Signs: Reviewed and stable  Complications: No notable events documented.

## 2023-11-20 NOTE — Anesthesia Postprocedure Evaluation (Signed)
 Anesthesia Post Note  Patient: Emma Villarreal  Procedure(s) Performed: PHACOEMULSIFICATION, CATARACT, WITH IOL INSERTION 9.69 00:56.3 (Left: Eye)  Patient location during evaluation: PACU Anesthesia Type: MAC Level of consciousness: awake and alert Pain management: pain level controlled Vital Signs Assessment: post-procedure vital signs reviewed and stable Respiratory status: spontaneous breathing, nonlabored ventilation, respiratory function stable and patient connected to nasal cannula oxygen Cardiovascular status: stable and blood pressure returned to baseline Postop Assessment: no apparent nausea or vomiting Anesthetic complications: no   No notable events documented.   Last Vitals:  Vitals:   11/20/23 1045 11/20/23 1048  BP: (!) 106/56 104/63  Pulse: 68 67  Resp: (!) 21 (!) 24  Temp:  (!) 36.1 C  SpO2: 99% 97%    Last Pain:  Vitals:   11/20/23 1048  TempSrc:   PainSc: 0-No pain                 Schae Cando C Shantavia Jha

## 2023-12-01 ENCOUNTER — Other Ambulatory Visit: Payer: Self-pay | Admitting: Cardiovascular Disease

## 2023-12-01 NOTE — Telephone Encounter (Signed)
 Prescription refill request for Eliquis  received. Indication:afib Last office visit:4/25 Scr:0.82  12/24 Age: 83 Weight:57.6  kg  Prescription refilled

## 2023-12-30 DIAGNOSIS — Z7901 Long term (current) use of anticoagulants: Secondary | ICD-10-CM | POA: Diagnosis not present

## 2023-12-30 DIAGNOSIS — D6869 Other thrombophilia: Secondary | ICD-10-CM | POA: Diagnosis not present

## 2024-01-20 ENCOUNTER — Other Ambulatory Visit: Payer: Self-pay | Admitting: Family Medicine

## 2024-01-22 NOTE — Telephone Encounter (Signed)
 Requested Prescriptions  Pending Prescriptions Disp Refills   levothyroxine  (SYNTHROID ) 50 MCG tablet [Pharmacy Med Name: Levothyroxine  Sodium 50 MCG Oral Tablet] 90 tablet 0    Sig: TAKE 1 TABLET BY MOUTH BEFORE BREAKFAST. PATIENT NEEDS AN APPOINTMENT FOR CHRONIC DISEASE     Endocrinology:  Hypothyroid Agents Passed - 01/22/2024  7:59 AM      Passed - TSH in normal range and within 360 days    TSH  Date Value Ref Range Status  04/01/2023 2.560 0.450 - 4.500 uIU/mL Final         Passed - Valid encounter within last 12 months    Recent Outpatient Visits   None     Future Appointments             In 3 weeks Wittenborn, Barnie, NP [facility]

## 2024-02-06 ENCOUNTER — Other Ambulatory Visit: Payer: Self-pay | Admitting: Family Medicine

## 2024-02-10 ENCOUNTER — Ambulatory Visit: Admitting: Family Medicine

## 2024-02-10 NOTE — Progress Notes (Unsigned)
 Cardiology Clinic Note   Date: 02/12/2024 ID: [name], DOB [birth year], MRN [id number]  Primary Cardiologist:  Evalene Lunger, MD  Chief Complaint   Emma Villarreal is a 83 y.o. female who presents to the clinic today for routine follow up.   Patient Profile   Emma Villarreal is followed by Dr. Gollan for the history outlined below.       Past medical history significant for: PAF. Onset 2017. Cardioversion 2017. Hyperlipidemia. Lipid panel 04/01/2023: LDL 31, HDL 49, TG 103, total 99. Hypothyroidism. Tobacco abuse.  In summary, patient was previously followed by [facility] health cardiology.  She established care with Dr. Gollan on 06/06/2020.  She was seen in the hospital in 2017 and noted to be in A-fib.  She underwent cardioversion and was started on Eliquis  and metoprolol .  Echo at that time demonstrated EF 60 to 65%, no RWMA, moderate concentric LVH, moderate to severe pulmonary hypertension, mild MR, moderate to severe TR, moderate BAE.  She had repeat echo in September 2020 which demonstrated normal LV function, 2+ TR, 1+ AR/MR.  Stress testing was negative for ischemia.  At the time of her visit with Dr. Gollan it was unclear if she was having episodes of PAF.  She was instructed to take an extra dose of metoprolol  for sustained elevated heart rate.  In August 2022 metoprolol  tartrate was changed to metoprolol  succinate with instructions to take metoprolol  tartrate as needed.  Patient was seen in the office on 01/28/2023 for routine follow-up.  She was noted to be in A-fib with heart rate 92 bpm.  It was recommended she increase Toprol  to 50 mg daily.  She declined cardioversion.  She was working on quitting smoking.   Patient was last seen in the office by me on 08/11/2023 for routine follow-up.  She reported occasional palpitations described as heart racing typically in the setting of increased stress or emotional upset.  She managed episodes with as needed metoprolol  tartrate.  She  reported central chest pain described as tightness associated with heart racing and resolving with as needed metoprolol  or rest.  Discussed further evaluation with cardiac PET stress but patient declined.  She was instructed to contact the office if chest pain episodes increased in frequency or became more persistent.     History of Present Illness    Today, patient reports she is doing well. Patient denies shortness of breath, dyspnea on exertion, lower extremity edema, orthopnea or PND. No chest pain, pressure, or tightness. She reports feeling as though her heart is racing when she is emotional or stressed out. Last dose of metoprolol  tartrate about a month ago. She does not have awareness of arrhythmia unless she is physically checking her radial pulse. She reports performing seated exercises. She is interested in doing more physical activity and points out how much muscle mass she has lost.     ROS: All other systems reviewed and are otherwise negative except as noted in History of Present Illness.  EKGs/Labs Reviewed    EKG Interpretation Date/Time:  Thursday February 12 2024 15:45:18 EDT Ventricular Rate:  83 PR Interval:    QRS Duration:  88 QT Interval:  386 QTC Calculation: 453 R Axis:   54  Text Interpretation: Atrial fibrillation Nonspecific T wave abnormality When compared with ECG of 11-Aug-2023 15:35, Septal infarct is now Present Confirmed by Loistine Sober [phone]) on 02/12/2024 3:50:03 PM   04/01/2023: ALT 38; AST 34; BUN 10; Creatinine, Ser 0.82; Potassium 4.9; Sodium 139  04/01/2023: Hemoglobin 13.4; WBC 7.4   04/01/2023: TSH 2.560   Risk Assessment/Calculations     CHA2DS2-VASc Score = 3   This indicates a 3.2% annual risk of stroke. The patient's score is based upon: CHF History: 0 HTN History: 0 Diabetes History: 0 Stroke History: 0 Vascular Disease History: 0 Age Score: 2 Gender Score: 1             Physical Exam    VS:  BP 130/60   Pulse  83   Ht 5' 2 (1.575 m)   Wt 129 lb 12.8 oz (58.9 kg)   SpO2 95%   BMI 23.74 kg/m  , BMI Body mass index is 23.74 kg/m.  GEN: Well nourished, well developed, in no acute distress. Neck: No JVD or carotid bruits. Cardiac: Irregularly irregular rhythm.  No murmur. No rubs or gallops.   Respiratory:  Respirations regular and unlabored. Clear to auscultation without rales, wheezing or rhonchi. GI: Soft, nontender, nondistended. Extremities: Radials/DP/PT 2+ and equal bilaterally. No clubbing or cyanosis. No edema   Skin: Warm and dry, no rash. Neuro: Strength intact.  Assessment & Plan   PAF Onset 2017 with cardioversion performed at [facility].  Denies spontaneous bleeding concerns.  Patient reports occasional episodes of heart racing typically when she is upset. Last dose of prn metoprolol  tartrate 1 month ago. She has no awareness of arrhythmia unless she is checking her radial pulse then she feels the irregularity. She is not interested in any interventions to restore rhythm preferring rate control. Irregularly, irregular on exam today. HR 83 bpm.  - Continue Toprol , Lopressor  as needed, Eliquis .    Hyperlipidemia LDL 22 April 2023, at goal. - Continue atorvastatin . - Defer labs to PCP in December.    Tobacco abuse Patient continues to smoke a pack every 3 days. She reports increasing smoking secondary to being upset about the state of the world. She is interested in quitting.  -Smoking cessation encouraged.   Disposition: Return in 6 months or sooner as needed.          Signed, Barnie HERO. April Colter, DNP, NP-C

## 2024-02-12 ENCOUNTER — Ambulatory Visit: Attending: Student | Admitting: Student

## 2024-02-12 ENCOUNTER — Encounter: Payer: Self-pay | Admitting: Student

## 2024-02-12 VITALS — BP 130/60 | HR 83 | Ht 62.0 in | Wt 129.8 lb

## 2024-02-12 DIAGNOSIS — I4811 Longstanding persistent atrial fibrillation: Secondary | ICD-10-CM

## 2024-02-12 DIAGNOSIS — E7800 Pure hypercholesterolemia, unspecified: Secondary | ICD-10-CM | POA: Diagnosis not present

## 2024-02-12 DIAGNOSIS — Z720 Tobacco use: Secondary | ICD-10-CM | POA: Diagnosis not present

## 2024-02-12 MED ORDER — METOPROLOL SUCCINATE ER 50 MG PO TB24: 50.0000 mg | ORAL_TABLET | Freq: Every day | ORAL | 3 refills | Status: AC

## 2024-02-12 NOTE — Patient Instructions (Signed)
 Medication Instructions:   None ordered at this time   *If you need a refill on your cardiac medications before your next appointment, please call your pharmacy*  Lab Work:  None ordered at this time   If you have labs (blood work) drawn today and your tests are completely normal, you will receive your results only by:  MyChart Message (if you have MyChart) OR  A paper copy in the mail If you have any lab test that is abnormal or we need to change your treatment, we will call you to review the results.  Testing/Procedures:  None ordered at this time   Referrals:  None ordered at this time   Follow-Up:  At [facility], you and your health needs are our priority.  As part of our continuing mission to provide you with exceptional heart care, our providers are all part of one team.  This team includes your primary Cardiologist (physician) and Advanced Practice Providers or APPs (Physician Assistants and Nurse Practitioners) who all work together to provide you with the care you need, when you need it.  Your next appointment:   5 - 6 month(s)  Provider:    Barnie Hila, NP    We recommend signing up for the patient portal called MyChart.  Sign up information is provided on this After Visit Summary.  MyChart is used to connect with patients for Virtual Visits (Telemedicine).  Patients are able to view lab/test results, encounter notes, upcoming appointments, etc.  Non-urgent messages can be sent to your provider as well.   To learn more about what you can do with MyChart, go to [email or web address].

## 2024-04-16 ENCOUNTER — Other Ambulatory Visit: Payer: Self-pay | Admitting: Family Medicine

## 2024-04-17 ENCOUNTER — Other Ambulatory Visit: Payer: Self-pay | Admitting: Family Medicine

## 2024-04-17 DIAGNOSIS — J209 Acute bronchitis, unspecified: Secondary | ICD-10-CM

## 2024-05-24 ENCOUNTER — Other Ambulatory Visit: Payer: Self-pay | Admitting: Family Medicine

## 2024-05-24 ENCOUNTER — Other Ambulatory Visit: Payer: Self-pay | Admitting: Cardiovascular Disease

## 2024-05-24 DIAGNOSIS — E039 Hypothyroidism, unspecified: Secondary | ICD-10-CM

## 2024-05-27 NOTE — Telephone Encounter (Signed)
 Patient needs labs, has an appt 07/22/24.

## 2024-06-15 ENCOUNTER — Encounter: Admitting: Family Medicine

## 2024-07-13 ENCOUNTER — Encounter: Admitting: Family Medicine

## 2024-07-22 ENCOUNTER — Ambulatory Visit: Admitting: Student

## 2024-10-05 ENCOUNTER — Ambulatory Visit
